# Patient Record
Sex: Female | Born: 1985 | Race: Black or African American | Hispanic: No | Marital: Single | Smoking: Never smoker
Health system: Southern US, Community
[De-identification: ages and names within clinical notes are randomized; demographics above are authoritative.]

## PROBLEM LIST (undated history)

## (undated) DIAGNOSIS — J45909 Unspecified asthma, uncomplicated: Secondary | ICD-10-CM

## (undated) DIAGNOSIS — F419 Anxiety disorder, unspecified: Secondary | ICD-10-CM

## (undated) DIAGNOSIS — F32A Depression, unspecified: Secondary | ICD-10-CM

## (undated) DIAGNOSIS — F329 Major depressive disorder, single episode, unspecified: Secondary | ICD-10-CM

## (undated) DIAGNOSIS — T7840XA Allergy, unspecified, initial encounter: Secondary | ICD-10-CM

## (undated) HISTORY — PX: WISDOM TOOTH EXTRACTION: SHX21

## (undated) HISTORY — DX: Allergy, unspecified, initial encounter: T78.40XA

## (undated) HISTORY — DX: Unspecified asthma, uncomplicated: J45.909

## (undated) HISTORY — PX: SKIN LESION EXCISION: SHX2412

## (undated) HISTORY — PX: TONSILLECTOMY: SUR1361

---
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
## 2004-03-03 ENCOUNTER — Emergency Department (HOSPITAL_COMMUNITY): Admission: EM | Admit: 2004-03-03 | Discharge: 2004-03-03 | Payer: Self-pay | Admitting: Family Medicine

## 2004-06-14 ENCOUNTER — Emergency Department (HOSPITAL_COMMUNITY): Admission: EM | Admit: 2004-06-14 | Discharge: 2004-06-14 | Payer: Self-pay | Admitting: *Deleted

## 2005-03-29 ENCOUNTER — Emergency Department (HOSPITAL_COMMUNITY): Admission: EM | Admit: 2005-03-29 | Discharge: 2005-03-29 | Payer: Self-pay | Admitting: Family Medicine

## 2005-08-05 ENCOUNTER — Other Ambulatory Visit: Admission: RE | Admit: 2005-08-05 | Discharge: 2005-08-05 | Payer: Self-pay | Admitting: Gynecology

## 2010-05-26 ENCOUNTER — Ambulatory Visit: Payer: Self-pay | Admitting: Women's Health

## 2010-05-26 ENCOUNTER — Other Ambulatory Visit: Admission: RE | Admit: 2010-05-26 | Discharge: 2010-05-26 | Payer: Self-pay | Admitting: Gynecology

## 2010-08-13 ENCOUNTER — Ambulatory Visit: Payer: Self-pay | Admitting: Women's Health

## 2010-08-24 ENCOUNTER — Ambulatory Visit: Payer: Self-pay | Admitting: Women's Health

## 2010-10-22 ENCOUNTER — Ambulatory Visit: Payer: Self-pay | Admitting: Women's Health

## 2010-11-22 HISTORY — PX: COSMETIC SURGERY: SHX468

## 2012-05-03 ENCOUNTER — Ambulatory Visit (INDEPENDENT_AMBULATORY_CARE_PROVIDER_SITE_OTHER): Payer: BC Managed Care – PPO | Admitting: Women's Health

## 2012-05-03 ENCOUNTER — Encounter: Payer: Self-pay | Admitting: Women's Health

## 2012-05-03 ENCOUNTER — Other Ambulatory Visit (HOSPITAL_COMMUNITY)
Admission: RE | Admit: 2012-05-03 | Discharge: 2012-05-03 | Disposition: A | Discharge status: Home / Self Care | Payer: BC Managed Care – PPO | Source: Ambulatory Visit | Attending: Obstetrics and Gynecology | Admitting: Obstetrics and Gynecology

## 2012-05-03 VITALS — BP 118/66 | Ht 65.0 in | Wt 204.0 lb

## 2012-05-03 DIAGNOSIS — Z113 Encounter for screening for infections with a predominantly sexual mode of transmission: Secondary | ICD-10-CM

## 2012-05-03 DIAGNOSIS — Z01419 Encounter for gynecological examination (general) (routine) without abnormal findings: Secondary | ICD-10-CM | POA: Insufficient documentation

## 2012-05-03 DIAGNOSIS — IMO0001 Reserved for inherently not codable concepts without codable children: Secondary | ICD-10-CM

## 2012-05-03 DIAGNOSIS — N898 Other specified noninflammatory disorders of vagina: Secondary | ICD-10-CM

## 2012-05-03 DIAGNOSIS — F419 Anxiety disorder, unspecified: Secondary | ICD-10-CM

## 2012-05-03 DIAGNOSIS — Z833 Family history of diabetes mellitus: Secondary | ICD-10-CM

## 2012-05-03 DIAGNOSIS — F411 Generalized anxiety disorder: Secondary | ICD-10-CM

## 2012-05-03 DIAGNOSIS — Z309 Encounter for contraceptive management, unspecified: Secondary | ICD-10-CM

## 2012-05-03 LAB — CBC WITH DIFFERENTIAL/PLATELET
Basophils Absolute: 0 10*3/uL (ref 0.0–0.1)
Eosinophils Relative: 6 % — ABNORMAL HIGH (ref 0–5)
HCT: 42 % (ref 36.0–46.0)
Lymphocytes Relative: 47 % — ABNORMAL HIGH (ref 12–46)
Lymphs Abs: 2.5 10*3/uL (ref 0.7–4.0)
MCV: 77.1 fL — ABNORMAL LOW (ref 78.0–100.0)
Neutro Abs: 2 10*3/uL (ref 1.7–7.7)
Platelets: 288 10*3/uL (ref 150–400)
RBC: 5.45 MIL/uL — ABNORMAL HIGH (ref 3.87–5.11)
WBC: 5.3 10*3/uL (ref 4.0–10.5)

## 2012-05-03 LAB — WET PREP FOR TRICH, YEAST, CLUE: Trich, Wet Prep: NONE SEEN

## 2012-05-03 MED ORDER — FLUCONAZOLE 150 MG PO TABS: 150.0000 mg | ORAL_TABLET | Freq: Once | ORAL | Status: AC

## 2012-05-03 MED ORDER — ETONOGESTREL-ETHINYL ESTRADIOL 0.12-0.015 MG/24HR VA RING: VAGINAL_RING | VAGINAL | Status: DC

## 2012-05-03 NOTE — Progress Notes (Signed)
Melissa Greene 1986-09-17 [id number]    History:    The patient presents for annual exam.  Monthly 5-6 day cycle/condoms/new partner. Gardasil series completed in 07. 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   Past medical history, past surgical history, family history and social history were all reviewed and documented in the EPIC chart. Math teacher in middle school. Had plastic surgery on arms due to excess skin/fat from weight loss in 2012.   ROS:  A  ROS was performed and pertinent positives and negatives are included in the history.  Exam:  Filed Vitals:   05/03/12 1036  BP: 118/66    General appearance:  Normal Head/Neck:  Normal, without cervical or supraclavicular adenopathy. Thyroid:  Symmetrical, normal in size, without palpable masses or nodularity. Respiratory  Effort:  Normal  Auscultation:  Clear without wheezing or rhonchi Cardiovascular  Auscultation:  Regular rate, without rubs, murmurs or gallops  Edema/varicosities:  Not grossly evident Abdominal  Soft,nontender, without masses, guarding or rebound.  Liver/spleen:  No organomegaly noted  Hernia:  None appreciated  Skin  Inspection:  Grossly normal  Palpation:  Grossly normal Neurologic/psychiatric  Orientation:  Normal with appropriate conversation.  Mood/affect:  Normal  Genitourinary    Breasts: Examined lying and sitting.     Right: Without masses, retractions, discharge or axillary adenopathy.     Left: Without masses, retractions, discharge or axillary adenopathy.   Inguinal/mons:  Normal without inguinal adenopathy  External genitalia:  Normal  BUS/Urethra/Skene's glands:  Normal  Bladder:  Normal  Vagina:  Normal-wet prep negative  Cervix:  Normal  Uterus:   normal in size, shape and contour.  Midline and mobile  Adnexa/parametria:     Rt: Without masses or tenderness.   Lt: Without masses or tenderness.  Anus and perineum: Normal  Digital rectal  exam: Normal sphincter tone without palpated masses or tenderness  Assessment/Plan:  26 y.o. SBF G0 for annual exam with complaint of vaginal irritation occasionally.  Normal GYN exam STD screen  Plan: Contraception options reviewed, requested NuvaRing, prescription, proper use, start up instructions, slight risk for blood clots and strokes reviewed, encouraged condoms until permanent partner. SBE's, exercise, calcium rich diet, MVI daily encouraged. CBC, glucose, UA, Pap, GC/Chlamydia, declines HIV, hepatitis or RPR.   Farhana Fellows J WHNP, 1:20 PM 05/03/2012

## 2012-05-03 NOTE — Patient Instructions (Signed)

## 2012-05-04 LAB — URINALYSIS W MICROSCOPIC + REFLEX CULTURE
Bilirubin Urine: NEGATIVE
Crystals: NONE SEEN
Leukocytes, UA: NEGATIVE
Nitrite: NEGATIVE
Protein, ur: NEGATIVE mg/dL
Specific Gravity, Urine: 1.02 (ref 1.005–1.030)
Squamous Epithelial / LPF: NONE SEEN
Urobilinogen, UA: 0.2 mg/dL (ref 0.0–1.0)

## 2012-05-04 LAB — GC/CHLAMYDIA PROBE AMP, GENITAL: GC Probe Amp, Genital: NEGATIVE

## 2012-11-07 ENCOUNTER — Encounter: Payer: Self-pay | Admitting: Gynecology

## 2012-11-07 ENCOUNTER — Ambulatory Visit (INDEPENDENT_AMBULATORY_CARE_PROVIDER_SITE_OTHER): Payer: BC Managed Care – PPO | Admitting: Gynecology

## 2012-11-07 DIAGNOSIS — N898 Other specified noninflammatory disorders of vagina: Secondary | ICD-10-CM

## 2012-11-07 NOTE — Patient Instructions (Signed)
Take Flagyl twice daily for 7 days, avoid alcohol while taking. Start NuvaRing after next menses. Continues condoms as backup contraception as well as to decrease STDs. Follow up if any issues.

## 2012-11-07 NOTE — Progress Notes (Signed)
Patient presents complaining of a brownish discharge. Had history of intercourse without a condom then used a condom and subsequently started the NuvaRing with her next following menses. Had a discharge following this and took the NuvaRing out. She now has a persistent brownish discharge. No pain odor itching.  Exam with [employer] assistant External BUS vagina with white discharge. Cervix normal. Uterus normal size midline mobile nontender. Adnexa without masses or tenderness.  Assessment and plan: Wet prep shows bacterial vaginosis. GC Chlamydia screen done. Treat Flagyl 500 twice a day x7 days, alcohol avoidance. She will restart NuvaRing following the onset of her next menses, Sunday start. Backup contraception with condoms regardless as well as to help decrease STDs.

## 2012-11-10 ENCOUNTER — Telehealth: Payer: Self-pay | Admitting: *Deleted

## 2012-11-10 MED ORDER — CLINDAMYCIN PHOSPHATE 2 % VA CREA: 1.0000 | TOPICAL_CREAM | Freq: Every day | VAGINAL | Status: DC

## 2012-11-10 MED ORDER — METRONIDAZOLE 500 MG PO TABS: 500.0000 mg | ORAL_TABLET | Freq: Two times a day (BID) | ORAL | Status: DC

## 2012-11-10 NOTE — Telephone Encounter (Signed)
Cleocin vaginal cream nightly x7 days 

## 2012-11-10 NOTE — Telephone Encounter (Signed)
Pt was giving rx for Flagyl 500 twice a day x7 days on 11/07/12, pt said she would prefer to take a gel rather than pill if possible. Please advise

## 2012-11-10 NOTE — Telephone Encounter (Signed)
Pt called because pharmacy never received rx for Flagyl 500 twice a day x7 days. rx sent, pt informed.

## 2012-11-13 NOTE — Telephone Encounter (Signed)
Left on voicemail rx sent. 

## 2013-02-19 ENCOUNTER — Encounter: Payer: Self-pay | Admitting: *Deleted

## 2013-02-19 ENCOUNTER — Encounter: Payer: Self-pay | Admitting: Women's Health

## 2013-02-19 ENCOUNTER — Ambulatory Visit (INDEPENDENT_AMBULATORY_CARE_PROVIDER_SITE_OTHER): Payer: BC Managed Care – PPO | Admitting: Women's Health

## 2013-02-19 DIAGNOSIS — B373 Candidiasis of vulva and vagina: Secondary | ICD-10-CM

## 2013-02-19 DIAGNOSIS — Z309 Encounter for contraceptive management, unspecified: Secondary | ICD-10-CM

## 2013-02-19 DIAGNOSIS — Z113 Encounter for screening for infections with a predominantly sexual mode of transmission: Secondary | ICD-10-CM

## 2013-02-19 DIAGNOSIS — L293 Anogenital pruritus, unspecified: Secondary | ICD-10-CM

## 2013-02-19 DIAGNOSIS — N898 Other specified noninflammatory disorders of vagina: Secondary | ICD-10-CM

## 2013-02-19 DIAGNOSIS — IMO0001 Reserved for inherently not codable concepts without codable children: Secondary | ICD-10-CM

## 2013-02-19 LAB — WET PREP FOR TRICH, YEAST, CLUE
Clue Cells Wet Prep HPF POC: NONE SEEN
Trich, Wet Prep: NONE SEEN
Yeast Wet Prep HPF POC: NONE SEEN

## 2013-02-19 MED ORDER — FLUCONAZOLE 100 MG PO TABS: 100.0000 mg | ORAL_TABLET | Freq: Every day | ORAL | Status: DC

## 2013-02-19 MED ORDER — NORGESTIMATE-ETH ESTRADIOL 0.25-35 MG-MCG PO TABS: 1.0000 | ORAL_TABLET | Freq: Every day | ORAL | Status: DC

## 2013-02-19 NOTE — Progress Notes (Signed)
Patient ID: Melissa Greene, female   DOB: 09/19/1986, 27 y.o.   MRN: [id number] Presents with complaint of vaginal itching and would like to change to oral contraceptive from nuva ring. Placed nuva ring in on Sunday, removed due to itching, currently on cycle.Questions if partner unfaithful. Denies urinary symptoms, abdominal pain, or fever. Took diflucan several days ago but has not had complete relief.  Exam: Appears well, external genitalia slightly erythematous at introitus, speculum exam scant menses noted, wet prep negative, GC/Chlamydia culture taken, bimanual no CMT or adnexal fullness or tenderness.  Vaginal pruritus Contraception management STD screen  Plan: Diflucan 100 times one dose, prescription, proper use given and reviewed states uses Diflucan 100 after most cycles to prevent recurrent yeast with good relief. GC/Chlamydia culture pending, declines HIV today will have labs drawn at annual exam in June. Condoms encouraged if sexually active. Contraception options reviewed will try Ortho-Cyclen, prescription, proper use given and reviewed slight risk for blood clots and strokes. Instructed to call if continued problems.Aware of yeast prevention, reviewed wet prep negative.

## 2013-02-19 NOTE — Progress Notes (Signed)
Patient ID: Melissa Greene, female   DOB: 28-Sep-1986, 28 y.o.   MRN: [id number] Pt left message c/o extreme vaginal itching after cycle, I left message on her voicemail that OV best.

## 2013-02-19 NOTE — Patient Instructions (Addendum)
Monilial Vaginitis Vaginitis in a soreness, swelling and redness (inflammation) of the vagina and vulva. Monilial vaginitis is not a sexually transmitted infection. CAUSES  Yeast vaginitis is caused by yeast (candida) that is normally found in your vagina. With a yeast infection, the candida has overgrown in number to a point that upsets the chemical balance. SYMPTOMS   White, thick vaginal discharge.  Swelling, itching, redness and irritation of the vagina and possibly the lips of the vagina (vulva).  Burning or painful urination.  Painful intercourse. DIAGNOSIS  Things that may contribute to monilial vaginitis are:  Postmenopausal and virginal states.  Pregnancy.  Infections.  Being tired, sick or stressed, especially if you had monilial vaginitis in the past.  Diabetes. Good control will help lower the chance.  Birth control pills.  Tight fitting garments.  Using bubble bath, feminine sprays, douches or deodorant tampons.  Taking certain medications that kill germs (antibiotics).  Sporadic recurrence can occur if you become ill. TREATMENT  Your caregiver will give you medication.  There are several kinds of anti monilial vaginal creams and suppositories specific for monilial vaginitis. For recurrent yeast infections, use a suppository or cream in the vagina 2 times a week, or as directed.  Anti-monilial or steroid cream for the itching or irritation of the vulva may also be used. Get your caregiver's permission.  Painting the vagina with methylene blue solution may help if the monilial cream does not work.  Eating yogurt may help prevent monilial vaginitis. HOME CARE INSTRUCTIONS   Finish all medication as prescribed.  Do not have sex until treatment is completed or after your caregiver tells you it is okay.  Take warm sitz baths.  Do not douche.  Do not use tampons, especially scented ones.  Wear cotton underwear.  Avoid tight pants and panty  hose.  Tell your sexual partner that you have a yeast infection. They should go to their caregiver if they have symptoms such as mild rash or itching.  Your sexual partner should be treated as well if your infection is difficult to eliminate.  Practice safer sex. Use condoms.  Some vaginal medications cause latex condoms to fail. Vaginal medications that harm condoms are:  Cleocin cream.  Butoconazole (Femstat).  Terconazole (Terazol) vaginal suppository.  Miconazole (Monistat) (may be purchased over the counter). SEEK MEDICAL CARE IF:   You have a temperature by mouth above 102 F (38.9 C).  The infection is getting worse after 2 days of treatment.  The infection is not getting better after 3 days of treatment.  You develop blisters in or around your vagina.  You develop vaginal bleeding, and it is not your menstrual period.  You have pain when you urinate.  You develop intestinal problems.  You have pain with sexual intercourse. Document Released: 08/18/2005 Document Revised: 01/31/2012 Document Reviewed: 05/02/2009 ExitCare Patient Information 2013 [employer].  

## 2013-02-20 LAB — URINALYSIS W MICROSCOPIC + REFLEX CULTURE
Ketones, ur: NEGATIVE mg/dL
Nitrite: NEGATIVE
Protein, ur: NEGATIVE mg/dL

## 2013-02-20 LAB — GC/CHLAMYDIA PROBE AMP
CT Probe RNA: NEGATIVE
GC Probe RNA: NEGATIVE

## 2013-02-21 LAB — URINE CULTURE: Colony Count: 80000

## 2013-02-23 ENCOUNTER — Telehealth: Payer: Self-pay | Admitting: *Deleted

## 2013-02-23 NOTE — Telephone Encounter (Signed)
Pt informed with Negative GC/Chalmydia results. Pt said she tried to have sex for 2 night and had pain during sex. Pt was given #15 tablet of diflucan, I told pt pain during sex maybe related to irritation for infection, wait until finish with Rx.

## 2013-03-12 ENCOUNTER — Telehealth: Payer: Self-pay | Admitting: *Deleted

## 2013-03-12 ENCOUNTER — Encounter: Payer: Self-pay | Admitting: *Deleted

## 2013-03-12 MED ORDER — NYSTATIN-TRIAMCINOLONE 100000-0.1 UNIT/GM-% EX OINT: TOPICAL_OINTMENT | Freq: Two times a day (BID) | CUTANEOUS | Status: DC

## 2013-03-12 MED ORDER — TERCONAZOLE 0.8 % VA CREA: 1.0000 | TOPICAL_CREAM | Freq: Every day | VAGINAL | Status: DC

## 2013-03-12 NOTE — Telephone Encounter (Signed)
I would recommend Mytrex cream externally now. Terazol 3 day cream after her menses. Office visit if symptoms persist

## 2013-03-12 NOTE — Telephone Encounter (Signed)
Left the below note on pt voicemail, rx sent.

## 2013-03-12 NOTE — Telephone Encounter (Signed)
(  You are back up MD) pt seen nancy for vaginal itching given rx for # 15 diflucan on 02/19/13, she took medication as directed and still having extreme vaginal itching. Pt said she has had pain with intercourse and had some bleeding after intercourse as well, no vaginal discharge, no odor. Her cycle is on now and can't make OV due to this, she asked for your recommendations? She states the itching feels like it internal. Please advise

## 2013-03-13 ENCOUNTER — Encounter: Payer: Self-pay | Admitting: Gynecology

## 2013-03-13 ENCOUNTER — Ambulatory Visit (INDEPENDENT_AMBULATORY_CARE_PROVIDER_SITE_OTHER): Payer: BC Managed Care – PPO | Admitting: Gynecology

## 2013-03-13 DIAGNOSIS — N898 Other specified noninflammatory disorders of vagina: Secondary | ICD-10-CM

## 2013-03-13 DIAGNOSIS — L293 Anogenital pruritus, unspecified: Secondary | ICD-10-CM

## 2013-03-13 MED ORDER — FLUCONAZOLE 200 MG PO TABS: 200.0000 mg | ORAL_TABLET | Freq: Every day | ORAL | Status: DC

## 2013-03-13 NOTE — Progress Notes (Signed)
Patient presents with a history of being treated for bacterial vaginosis in December. Her symptoms cleared and was subsequently seen by Harriett Sine 02/19/2013 with intense vaginal itching.  Was treated with Diflucan 100 mg. Her itching has continued and she noted some perivaginal swelling. She was worried that she had herpes.  Exam with Selena Batten assistant External with small pigmented area on the clitoral hood benign in appearance. No ulcerative or inflammatory changes. Currently on menses. Vagina with blood. Cervix grossly normal. Uterus grossly normal in size shape contour midline mobile nontender. Adnexa without masses or tenderness.  Assessment and plan: Persistent vaginal itching. I think probably inadequately treated yeast. Unable to evaluate down deep to the menses flow. We'll treat aggressively with Diflucan 200 mg daily x5 days. She does have a prescription for Mytrex that was called in several days ago and she has not used it yet and will apply this 2-3 times daily. Followup if symptoms persist or recur. I discussed the small pigmented area which I think is a nevus. She was worried that it was herpes. I did ask her to keep track of this and his lungs remains unchanged we'll follow. Would certainly change or enlarge we may have to consider biopsy.

## 2013-03-13 NOTE — Patient Instructions (Signed)
Take Diflucan pill daily for 5 days. Apply the Mytrex cream 2-3 times daily to the outside. Followup if her symptoms persist or recur. Follow the pigmented area over the clitoral region. If it changes at all we may have to consider biopsy.

## 2013-07-26 ENCOUNTER — Ambulatory Visit (INDEPENDENT_AMBULATORY_CARE_PROVIDER_SITE_OTHER): Payer: BC Managed Care – PPO | Admitting: Women's Health

## 2013-07-26 ENCOUNTER — Encounter: Payer: Self-pay | Admitting: Women's Health

## 2013-07-26 VITALS — BP 126/78 | Ht 66.0 in | Wt 229.6 lb

## 2013-07-26 DIAGNOSIS — N760 Acute vaginitis: Secondary | ICD-10-CM

## 2013-07-26 DIAGNOSIS — N898 Other specified noninflammatory disorders of vagina: Secondary | ICD-10-CM

## 2013-07-26 DIAGNOSIS — B9689 Other specified bacterial agents as the cause of diseases classified elsewhere: Secondary | ICD-10-CM

## 2013-07-26 DIAGNOSIS — A499 Bacterial infection, unspecified: Secondary | ICD-10-CM

## 2013-07-26 LAB — WET PREP FOR TRICH, YEAST, CLUE: WBC, Wet Prep HPF POC: NONE SEEN

## 2013-07-26 MED ORDER — METRONIDAZOLE 0.75 % VA GEL: VAGINAL | Status: DC

## 2013-07-26 MED ORDER — METRONIDAZOLE 500 MG PO TABS: 500.0000 mg | ORAL_TABLET | Freq: Two times a day (BID) | ORAL | Status: DC

## 2013-07-26 NOTE — Patient Instructions (Addendum)
Bacterial Vaginosis Bacterial vaginosis (BV) is a vaginal infection where the normal balance of bacteria in the vagina is disrupted. The normal balance is then replaced by an overgrowth of certain bacteria. There are several different kinds of bacteria that can cause BV. BV is the most common vaginal infection in women of childbearing age. CAUSES   The cause of BV is not fully understood. BV develops when there is an increase or imbalance of harmful bacteria.  Some activities or behaviors can upset the normal balance of bacteria in the vagina and put women at increased risk including:  Having a new sex partner or multiple sex partners.  Douching.  Using an intrauterine device (IUD) for contraception.  It is not clear what role sexual activity plays in the development of BV. However, women that have never had sexual intercourse are rarely infected with BV. Women do not get BV from toilet seats, bedding, swimming pools or from touching objects around them.  SYMPTOMS   Grey vaginal discharge.  A fish-like odor with discharge, especially after sexual intercourse.  Itching or burning of the vagina and vulva.  Burning or pain with urination.  Some women have no signs or symptoms at all. DIAGNOSIS  Your caregiver must examine the vagina for signs of BV. Your caregiver will perform lab tests and look at the sample of vaginal fluid through a microscope. They will look for bacteria and abnormal cells (clue cells), a pH test higher than 4.5, and a positive amine test all associated with BV.  RISKS AND COMPLICATIONS   Pelvic inflammatory disease (PID).  Infections following gynecology surgery.  Developing HIV.  Developing herpes virus. TREATMENT  Sometimes BV will clear up without treatment. However, all women with symptoms of BV should be treated to avoid complications, especially if gynecology surgery is planned. Female partners generally do not need to be treated. However, BV may spread  between female sex partners so treatment is helpful in preventing a recurrence of BV.   BV may be treated with antibiotics. The antibiotics come in either pill or vaginal cream forms. Either can be used with nonpregnant or pregnant women, but the recommended dosages differ. These antibiotics are not harmful to the baby.  BV can recur after treatment. If this happens, a second round of antibiotics will often be prescribed.  Treatment is important for pregnant women. If not treated, BV can cause a premature delivery, especially for a pregnant woman who had a premature birth in the past. All pregnant women who have symptoms of BV should be checked and treated.  For chronic reoccurrence of BV, treatment with a type of prescribed gel vaginally twice a week is helpful. HOME CARE INSTRUCTIONS   Finish all medication as directed by your caregiver.  Do not have sex until treatment is completed.  Tell your sexual partner that you have a vaginal infection. They should see their caregiver and be treated if they have problems, such as a mild rash or itching.  Practice safe sex. Use condoms. Only have 1 sex partner. PREVENTION  Basic prevention steps can help reduce the risk of upsetting the natural balance of bacteria in the vagina and developing BV:  Do not have sexual intercourse (be abstinent).  Do not douche.  Use all of the medicine prescribed for treatment of BV, even if the signs and symptoms go away.  Tell your sex partner if you have BV. That way, they can be treated, if needed, to prevent reoccurrence. SEEK MEDICAL CARE IF:     Your symptoms are not improving after 3 days of treatment.  You have increased discharge, pain, or fever. MAKE SURE YOU:   Understand these instructions.  Will watch your condition.  Will get help right away if you are not doing well or get worse. FOR MORE INFORMATION  Division of STD Prevention (DSTDP), Centers for Disease Control and Prevention:  www.cdc.gov/std American Social Health Association (ASHA): www.ashastd.org  Document Released: 11/08/2005 Document Revised: 01/31/2012 Document Reviewed: 05/01/2009 ExitCare Patient Information 2014 [employer].  

## 2013-07-26 NOTE — Progress Notes (Signed)
Patient ID: Melissa Greene, female   DOB: 09-Mar-1986, 27 y.o.   MRN: [id number] Presents with complaint of vaginal discharge with odor. Reports has had problems with recurrent BV. Has used over-the-counter Monistat without relief. Monthly cycle, condoms usually, requests  STD screen. Denies urinary symptoms, abdominal pain, fever.  Exam: Appears well, external genitalia within normal limits, speculum exam moderate amount of menses with odor noted, wet prep positive for amines, clues, TNTC bacteria. GC/Chlamydia culture taken. Bimanual no CMT or adnexal fullness or tenderness. Exam somewhat limited due to abdominal girth.  Bacteria vaginosis  Plan: Flagyl 500 twice daily for 7 days, alcohol precautions reviewed, call or return if no relief of symptoms, condoms encouraged until permanent partner. Declines contraception. GC/Chlamydia culture pending. Will check HIV, hepatitis and RPR at annual exam.

## 2013-07-27 LAB — GC/CHLAMYDIA PROBE AMP: CT Probe RNA: NEGATIVE

## 2013-09-27 ENCOUNTER — Other Ambulatory Visit: Payer: Self-pay

## 2013-10-26 ENCOUNTER — Ambulatory Visit (INDEPENDENT_AMBULATORY_CARE_PROVIDER_SITE_OTHER): Payer: BC Managed Care – PPO

## 2013-10-26 VITALS — BP 117/49 | HR 95 | Resp 18

## 2013-10-26 DIAGNOSIS — B070 Plantar wart: Secondary | ICD-10-CM

## 2013-10-26 DIAGNOSIS — M79609 Pain in unspecified limb: Secondary | ICD-10-CM

## 2013-10-26 DIAGNOSIS — Q828 Other specified congenital malformations of skin: Secondary | ICD-10-CM

## 2013-10-26 NOTE — Patient Instructions (Signed)
Pre-Operative Instructions  Congratulations, you have decided to take an important step to improving your quality of life.  You can be assured that the doctors of [facility] will be with you every step of the way.  1. Plan to be at the surgery center/hospital at least 1 (one) hour prior to your scheduled time unless otherwise directed by the surgical center/hospital staff.  You must have a responsible adult accompany you, remain during the surgery and drive you home.  Make sure you have directions to the surgical center/hospital and know how to get there on time. 2. For hospital based surgery you will need to obtain a history and physical form from your family physician within 1 month prior to the date of surgery- we will give you a form for you primary physician.  3. We make every effort to accommodate the date you request for surgery.  There are however, times where surgery dates or times have to be moved.  We will contact you as soon as possible if a change in schedule is required.   4. No Aspirin/Ibuprofen for one week before surgery.  If you are on aspirin, any non-steroidal anti-inflammatory medications (Mobic, Aleve, Ibuprofen) you should stop taking it 7 days prior to your surgery.  You make take Tylenol  For pain prior to surgery.  5. Medications- If you are taking daily heart and blood pressure medications, seizure, reflux, allergy, asthma, anxiety, pain or diabetes medications, make sure the surgery center/hospital is aware before the day of surgery so they may notify you which medications to take or avoid the day of surgery. 6. No food or drink after midnight the night before surgery unless directed otherwise by surgical center/hospital staff. 7. No alcoholic beverages 24 hours prior to surgery.  No smoking 24 hours prior to or 24 hours after surgery. 8. Wear loose pants or shorts- loose enough to fit over bandages, boots, and casts. 9. No slip on shoes, sneakers are best. 10. Bring  your boot with you to the surgery center/hospital.  Also bring crutches or a walker if your physician has prescribed it for you.  If you do not have this equipment, it will be provided for you after surgery. 11. If you have not been contracted by the surgery center/hospital by the day before your surgery, call to confirm the date and time of your surgery. 12. Leave-time from work may vary depending on the type of surgery you have.  Appropriate arrangements should be made prior to surgery with your employer. 13. Prescriptions will be provided immediately following surgery by your doctor.  Have these filled as soon as possible after surgery and take the medication as directed. 14. Remove nail polish on the operative foot. 15. Wash the night before surgery.  The night before surgery wash the foot and leg well with the antibacterial soap provided and water paying special attention to beneath the toenails and in between the toes.  Rinse thoroughly with water and dry well with a towel.  Perform this wash unless told not to do so by your physician.  Enclosed: 1 Ice pack (please put in freezer the night before surgery)   1 Hibiclens skin cleaner   Pre-op Instructions  If you have any questions regarding the instructions, do not hesitate to call our office.  [place]: [address] [place], [place] [address] [phone]  [place]: [address] [place], [place] [address] [phone]  [place]: [address]  [place], [place] [address] [phone]  Dr. Allison Silva   Tuchman DPM, Dr. Norman Regal DPM Dr. Analyse Angst DPM, Dr. M. Todd Hyatt DPM, Dr. Kathryn Egerton DPM 

## 2013-10-26 NOTE — Progress Notes (Signed)
   Subjective:    Patient ID: Melissa Greene, female    DOB: [birth year], 27 y.o.   MRN: [id number]  HPI I saw Dr. Raechel Chute last year for my right foot and hurts on the ball and the calluses hurt so bad and no swelling and sore and tender and since 2010 this is when it started    Review of Systems  Constitutional: Negative.   HENT: Negative.   Eyes: Negative.   Respiratory: Negative.   Cardiovascular: Negative.   Gastrointestinal: Negative.   Endocrine: Negative.   Genitourinary: Negative.   Musculoskeletal: Positive for back pain.  Skin: Negative.   Allergic/Immunologic: Negative.   Neurological: Negative.   Hematological: Bruises/bleeds easily.  Psychiatric/Behavioral: Negative.        Objective:   Physical Exam Neurovascular status is intact with pedal pulses palpable DP and PT +2/4 bilateral Refill timed 3-4 seconds all digits. Epicritic and Percocet to sensations intact and symmetric bilateral. Patient does have keratotic lesion sub-third metatarsal area right foot well nucleated circumscribed and painful. Patient relates this to posse stepping on some last 4 years ago getting out of the shower. Covered any bleeding is been treated by 4 doctors including Dr. Jarold Motto Dr. Raechel Chute. Patient has had an injection and some debridements all provided lower covered relief in the past. X-rays taken today reveal that the lesions proximal to the metatarsal head not in the third metatarsal head. Painful on direct lateral compression and debridement well nucleated core is identified this is consistent with porokeratosis or verruca plantaris. I did not feel that the lesion is secondary to plantar flexed metatarsal x-rays otherwise unremarkable. Hallux is rectus mild flexible digital contractures noted as well. No secondary infection is noted no ascending cellulitis or lymphangitis noted.     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 Plan at this time discussed options and having failed previous treatments my recommendations times for excision and biopsy do a full excision with suture closure this was explained the patient at this time consent form was reviewed and signed all questions asked by the patient are answered and surgery scheduled for this coming Monday in the [facility] office under local anesthetic block. Patient had an opportunity ask questions all questions asked medication are answered there no contraindications to surgery at this time. Patient is advised of his or verruca of his always transferred to come back at that is caused by a virus. Surgery scheduled at this time  Alvan Dame DPM

## 2013-10-29 ENCOUNTER — Ambulatory Visit (INDEPENDENT_AMBULATORY_CARE_PROVIDER_SITE_OTHER): Payer: BC Managed Care – PPO

## 2013-10-29 ENCOUNTER — Other Ambulatory Visit: Payer: Self-pay

## 2013-10-29 ENCOUNTER — Telehealth: Payer: Self-pay | Admitting: *Deleted

## 2013-10-29 DIAGNOSIS — M79609 Pain in unspecified limb: Secondary | ICD-10-CM

## 2013-10-29 DIAGNOSIS — Q828 Other specified congenital malformations of skin: Secondary | ICD-10-CM

## 2013-10-29 DIAGNOSIS — B070 Plantar wart: Secondary | ICD-10-CM

## 2013-10-29 MED ORDER — CLINDAMYCIN HCL 150 MG PO CAPS: 150.0000 mg | ORAL_CAPSULE | Freq: Four times a day (QID) | ORAL | Status: DC

## 2013-10-29 MED ORDER — HYDROCODONE-ACETAMINOPHEN 5-325 MG PO TABS: 1.0000 | ORAL_TABLET | ORAL | Status: DC | PRN

## 2013-10-29 NOTE — Patient Instructions (Signed)

## 2013-10-29 NOTE — Progress Notes (Signed)
Subjective:    Patient ID: Melissa Greene, female    DOB: February 27, 1986, 27 y.o.   MRN: [id number]  HPI patient presents for surgical excision of painful skin lesion plantar right foot, suspect verruca versus porokeratosis. Likely excision benign neoplasm skin for biopsy to    Review of Systems deferred at this time     Objective:   Physical Exam Neurovascular status is intact pedal pulses palpable bilateral epicritic and proprioceptive sensations intact and symmetric bilateral there is nucleated keratotic lesion sub-third MTP area proximal to that. Plantar right foot well nucleated circumscribed lesion is identified painful direct lateral compression. No new findings or changes noted orthopedic exam unremarkable noncontributory.       Assessment & Plan:  Operative note for right foot procedure.  Preoperative diagnosis: Suspect verruca plantaris versus porokeratosis. Benign neoplasm skin Postoperative diagnosis: The same Procedure:   Excision skin lesion plantar right foot 2 cm, and submitted for pathology. Anesthesia   local anesthetic block total of 6 cc 50-50 mixture 2% Xylocaine with epi 1 100,000 and 0.5% Marcaine plain. A regional     block Hemostasis   right ankle tourniquet 250 mm Hg x15 minutes, and the use of epinephrine with local block. EBL    minimal less than 1 cc Meds injectables  1 cc dexamethasone phosphate 4 mg  Indications   patient has had a several year history of painful nucleated keratotic lesion previous treatments and debridement provided     temporary relief based on symptomology and past history and patient request surgery proceeded scheduled. Findings and procedures   Patient was placed in the preoperative area where local anesthetic block was administered total of 6 cc 50-50 mixture of 2% Xylocaine with epinephrine 11 or thousand inserter 0.5% Marcaine plain in a regional sub-lesional block fashion. Patient was then transferred to the OR room. Patient  placed on table in the supine position. Local anesthetic administered blistered the foot was now prepped and draped in the usual aseptic manner utilizing Betadine was prepped. The right foot was then exsanguinated the Ace wrap and ankle tourniquet inflated to 250 mm Hg. The following procedure was then carried out  Excision skin lesion 2 cm plantar right foot. Attention was directed to the plantar aspect of the right foot sub-third MTP area. The nucleated lesion is identified and utilizing sharp dissection 2 semi-elliptical converging incisions were made in a longitudinal direction encompassing the skin lesion. The incisions were deepened to the level of subcutaneous tissue and the entire lesion was excised from the site in toto. The edges were noted to be smooth and intact. No foreign bodies were identified some of the underlying soft tissue and fat tissue was also excised with the lesion. At this time the edges were undermined utilizing hemostat. The site was lavaged with copious amounts of sterile antibiotic  solution and cleared of all soft tissue debris. At this time the skin edges were reapproximated and closed utilizing 3-0 nylon in a simple interrupted fashion the site was then infiltrated with 1 cc of dexamethasone phosphate 4 mg per cc. Betadine Xeroform and dry sterile dressing applied to the right foot, ankle tourniquet was then deflated with immediate return of perfusion to all digits being noted. Patient was returned to the arch recovery room in satisfactory condition placed in a Darco shoe for moderate ambulation. Oral and written postop instructions were given prescriptions for pain antibiotic medications were dispensed. Patient be recheck in one week for postop followup and dressing change. Should  note the lesion was submitted in formalin for pathology analysis.  Patient [name] procedure well was discharged from the office with or instructions and prescriptions being given will followup in one  week as instructed. 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  Alvan Dame DPM

## 2013-10-29 NOTE — Telephone Encounter (Signed)
Biopsy of neuclated painful skin lesion plantar Right foot sent by [employer] on 10/31/2103.

## 2013-11-02 ENCOUNTER — Ambulatory Visit: Payer: BC Managed Care – PPO

## 2013-11-06 ENCOUNTER — Telehealth: Payer: Self-pay | Admitting: *Deleted

## 2013-11-06 ENCOUNTER — Ambulatory Visit (INDEPENDENT_AMBULATORY_CARE_PROVIDER_SITE_OTHER): Payer: BC Managed Care – PPO

## 2013-11-06 VITALS — BP 131/59 | HR 75 | Temp 97.8°F | Resp 20 | Ht 64.0 in | Wt 230.0 lb

## 2013-11-06 DIAGNOSIS — B070 Plantar wart: Secondary | ICD-10-CM

## 2013-11-06 DIAGNOSIS — Z09 Encounter for follow-up examination after completed treatment for conditions other than malignant neoplasm: Secondary | ICD-10-CM

## 2013-11-06 MED ORDER — MELOXICAM 15 MG PO TABS: 15.0000 mg | ORAL_TABLET | Freq: Every day | ORAL | Status: DC

## 2013-11-06 NOTE — Telephone Encounter (Signed)
Dr Ralene Cork states biopsy result is verruca, and since some warts can become cancerous needs to be watched 6 moth to a 1 year.  Dr Dionne Bucy recommendations and result called to pt, she states understanding.

## 2013-11-06 NOTE — Patient Instructions (Signed)

## 2013-11-06 NOTE — Progress Notes (Addendum)
   Subjective:    Patient ID: Melissa Greene, female    DOB: 08/19/1986, 27 y.o.   MRN: [id number]   "I'm still in pain, It hurts when I walk."  HPI    Review of Systems no findings or changes     Objective:   Physical Exam Neurovascular status is intact patient is a day status post excision of porokeratosis or verrucoid lesion sub-third fourth metatarsal area of right foot. Patient is having some pain dressings intact and dry has been her feet a lot having some aching and throbbing as a result. Suggest elevating ice and prescription for Mobic is dispensed at this time. Otherwise neurovascular status is intact pedal pulses palpable incision clean dry and well coapted. Dry sterile compressive dressing reapplied at this time.       Assessment & Plan:  Good postop progress following excision of benign neoplasm skin suspect verruca plantar versus porokeratosis. Presto dressing reapplied at this time still waiting on pathology report patient be followed in one week for suture removal. Maintain dressing which is reapplied at this time allow for ice to help with edema and NSAID therapy. Reappointed one week Alvan Dame DPM  Addendum to note following patient's visit pathologic report was forwarded patient report was positive for atypical verrucous epithelial poor proliferation there was still some low-grade suspicion for squamous neoplasm. This worn as possible long-term monitoring. Patient will be contacted with her results the findings are achieved pretty consistent with a traumatized wart or verruca. However there are some atypical and enlarged nuclei. Nursing we'll contact patient with her results.  Alvan Dame DPM

## 2013-11-13 ENCOUNTER — Ambulatory Visit (INDEPENDENT_AMBULATORY_CARE_PROVIDER_SITE_OTHER): Payer: BC Managed Care – PPO

## 2013-11-13 VITALS — BP 121/61 | HR 67 | Resp 20 | Ht 64.0 in | Wt 230.0 lb

## 2013-11-13 DIAGNOSIS — Z09 Encounter for follow-up examination after completed treatment for conditions other than malignant neoplasm: Secondary | ICD-10-CM

## 2013-11-13 DIAGNOSIS — B070 Plantar wart: Secondary | ICD-10-CM

## 2013-11-13 NOTE — Progress Notes (Signed)
   Subjective:    Patient ID: Melissa Greene, female    DOB: [birth year], 27 y.o.   MRN: [id number]  "I think it's doing okay, I was having some pain last night.  I'm excited to jump in the shower today with both feet.  That Meloxicam made me feel loopy, I didn't like that."  HPI    Review of Systems deferred at this visit    Objective:   Physical Exam Neurovascular status is intact incision clean dry well coapted plantar right foot. At this time sutures are removed Neosporin and a gauze wrap is applied. Patient may resume normal bathing and hygiene starting later today or early tomorrow maintain Neosporin and Band-Aid or gauze wrap as needed for the first week. Maintain a thick soled padded a cushioned shoe at all times no barefoot or flimsy shoes monitor for any dehiscence or discharge patient is advised there is some scarring and some tenderness may be present for a week or more or up to a month or 2 after surgery monitor for any exacerbations.       Assessment & Plan:  Assessment good postop progress following excision of porokeratosis first verrucoid lesion plantar right foot sub-third fourth metatarsal area patient is doing well sutures removed may resume normal normal bathing and hygiene maintain cushioned shoes reappointed one month for long-term followup  Alvan Dame DPM

## 2013-11-13 NOTE — Patient Instructions (Signed)

## 2013-12-11 ENCOUNTER — Ambulatory Visit: Payer: BC Managed Care – PPO

## 2013-12-18 ENCOUNTER — Ambulatory Visit (INDEPENDENT_AMBULATORY_CARE_PROVIDER_SITE_OTHER): Payer: BC Managed Care – PPO

## 2013-12-18 VITALS — BP 141/78 | HR 80 | Resp 16

## 2013-12-18 DIAGNOSIS — Z09 Encounter for follow-up examination after completed treatment for conditions other than malignant neoplasm: Secondary | ICD-10-CM

## 2013-12-18 DIAGNOSIS — Q828 Other specified congenital malformations of skin: Secondary | ICD-10-CM

## 2013-12-18 DIAGNOSIS — B070 Plantar wart: Secondary | ICD-10-CM

## 2013-12-18 NOTE — Patient Instructions (Signed)
Recommend to apply lotion such as cocoa butter or any kind of cream or lotion to the bottom of the foot daily also use a pumice stone or callus file on an as-needed basis for the residual scar of the right foot.  Followup in 3 months for long-term postop check

## 2013-12-18 NOTE — Progress Notes (Signed)
   Subjective:    Patient ID: Edd ArbourMelanie M Leyh, female    DOB: 07/17/1986, 28 y.o.   MRN: [id number]  HPI Comments: "I am 150% better"     Review of Systems no new changes or findings    Objective:   Physical Exam Neurovascular status is intact pedal pulses palpable there is some thickening or scar tissue keratoses sub-fourth third fourth metatarsal area right foot which is debrided back at this time. Some tenderness and debridement otherwise doing much better than prior to surgery.       Assessment & Plan:  Assessment good postop progress status post excision of poor keratosis or verruca lesion plantar right foot sub-third fourth metatarsal area no discharge or drainage keratoses debrided recommended cocoa butter or any lotions the foot daily suggest a 3 month long-term postop followup and likely discharge thereafter next  Alvan Dameichard Eron Staat DPM

## 2014-01-25 ENCOUNTER — Encounter: Payer: Self-pay | Admitting: Women's Health

## 2014-01-25 ENCOUNTER — Ambulatory Visit (INDEPENDENT_AMBULATORY_CARE_PROVIDER_SITE_OTHER): Payer: BC Managed Care – PPO | Admitting: Women's Health

## 2014-01-25 DIAGNOSIS — A499 Bacterial infection, unspecified: Secondary | ICD-10-CM

## 2014-01-25 DIAGNOSIS — N760 Acute vaginitis: Secondary | ICD-10-CM

## 2014-01-25 DIAGNOSIS — Z113 Encounter for screening for infections with a predominantly sexual mode of transmission: Secondary | ICD-10-CM

## 2014-01-25 DIAGNOSIS — B9689 Other specified bacterial agents as the cause of diseases classified elsewhere: Secondary | ICD-10-CM

## 2014-01-25 LAB — WET PREP FOR TRICH, YEAST, CLUE
Trich, Wet Prep: NONE SEEN
Yeast Wet Prep HPF POC: NONE SEEN

## 2014-01-25 MED ORDER — METRONIDAZOLE 0.75 % VA GEL: VAGINAL | Status: DC

## 2014-01-25 MED ORDER — METRONIDAZOLE 500 MG PO TABS: 500.0000 mg | ORAL_TABLET | Freq: Two times a day (BID) | ORAL | Status: DC

## 2014-01-25 NOTE — Progress Notes (Signed)
Patient ID: Melissa Greene, female   DOB: 09-18-1986, 28 y.o.   MRN: [id number] Presents for GC/chlamydia screening, had unprotected intercourse. Has had problems with recurrent BV in the past, also states has had increased discharge. Denies urinary symptoms, abdominal pain or fever. Monthly cycles/condoms. Has rare intercourse.   Exam: Appears well. External genitalia within normal limits, speculum exam moderate milky discharge noted wet prep positive for amines, clues, TNTC bacteria. GC/Chlamydia culture pending. Bimanual no CMT or fullness noted.  Bacteria vaginosis STD screen  Plan: GC/Chlamydia culture pending. Declined HIV, hepatitis or RPR. Contraception options reviewed and declined. Will continue condoms. Flagyl 500 twice daily for 7 days, alcohol precautions reviewed. Uses one applicator of MetroGel after menses for prevention of BV.

## 2014-01-26 LAB — GC/CHLAMYDIA PROBE AMP
CT PROBE, AMP APTIMA: NEGATIVE
GC PROBE AMP APTIMA: NEGATIVE

## 2014-02-27 ENCOUNTER — Telehealth: Payer: Self-pay

## 2014-02-27 MED ORDER — LEVONORGESTREL 0.75 MG PO TABS: 0.7500 mg | ORAL_TABLET | Freq: Two times a day (BID) | ORAL | Status: DC

## 2014-02-27 NOTE — Telephone Encounter (Signed)
Rx sent. Patient informed. 

## 2014-02-27 NOTE — Telephone Encounter (Addendum)
Patient had unprotected sex earlier. Wants to take the morning after pill but with "financial difficulties".  It is expensive OTC but if we call it in for her she can use her BeneCard that has $ on it to pay for it. She just doesn't have the cash right now for OTC.  Ok to try to call it in for her ?

## 2014-02-27 NOTE — Telephone Encounter (Signed)
Yes, call in plan B emergency contraception   thanks

## 2014-05-02 ENCOUNTER — Telehealth: Payer: Self-pay

## 2014-05-02 ENCOUNTER — Other Ambulatory Visit: Payer: Self-pay | Admitting: Women's Health

## 2014-05-02 MED ORDER — LEVONORGESTREL 0.75 MG PO TABS: 0.7500 mg | ORAL_TABLET | Freq: Two times a day (BID) | ORAL | Status: DC

## 2014-05-02 NOTE — Telephone Encounter (Signed)
Ok please call in plan B emergency contraception

## 2014-05-02 NOTE — Telephone Encounter (Signed)
Patient wants you to prescribe Plan B.  If it is called in to pharmacy she said she can use her Flex Dollars and right now she doesn't have money to buy over the counter med.  OK?

## 2014-05-02 NOTE — Telephone Encounter (Signed)
Rx sent. Patient informed. 

## 2014-05-02 NOTE — Telephone Encounter (Signed)
Error encounter already opened

## 2014-06-06 ENCOUNTER — Telehealth: Payer: Self-pay | Admitting: *Deleted

## 2014-06-06 NOTE — Telephone Encounter (Signed)
Pt called with questions about pregnancy UPT. All question answered.

## 2014-10-22 ENCOUNTER — Ambulatory Visit (INDEPENDENT_AMBULATORY_CARE_PROVIDER_SITE_OTHER): Payer: BC Managed Care – PPO | Admitting: Women's Health

## 2014-10-22 ENCOUNTER — Other Ambulatory Visit (HOSPITAL_COMMUNITY)
Admission: RE | Admit: 2014-10-22 | Discharge: 2014-10-22 | Disposition: A | Discharge status: Home / Self Care | Payer: BC Managed Care – PPO | Source: Ambulatory Visit | Attending: Gynecology | Admitting: Gynecology

## 2014-10-22 ENCOUNTER — Encounter: Payer: Self-pay | Admitting: Women's Health

## 2014-10-22 VITALS — BP 126/80 | Ht 64.0 in | Wt 238.0 lb

## 2014-10-22 DIAGNOSIS — Z833 Family history of diabetes mellitus: Secondary | ICD-10-CM

## 2014-10-22 DIAGNOSIS — Z30019 Encounter for initial prescription of contraceptives, unspecified: Secondary | ICD-10-CM

## 2014-10-22 DIAGNOSIS — N898 Other specified noninflammatory disorders of vagina: Secondary | ICD-10-CM

## 2014-10-22 DIAGNOSIS — B9689 Other specified bacterial agents as the cause of diseases classified elsewhere: Secondary | ICD-10-CM

## 2014-10-22 DIAGNOSIS — Z01419 Encounter for gynecological examination (general) (routine) without abnormal findings: Secondary | ICD-10-CM

## 2014-10-22 DIAGNOSIS — N760 Acute vaginitis: Secondary | ICD-10-CM

## 2014-10-22 DIAGNOSIS — A499 Bacterial infection, unspecified: Secondary | ICD-10-CM

## 2014-10-22 LAB — WET PREP FOR TRICH, YEAST, CLUE: Trich, Wet Prep: NONE SEEN

## 2014-10-22 MED ORDER — METRONIDAZOLE 0.75 % VA GEL: VAGINAL | Status: DC

## 2014-10-22 MED ORDER — FLUCONAZOLE 150 MG PO TABS: 150.0000 mg | ORAL_TABLET | Freq: Once | ORAL | Status: DC

## 2014-10-22 MED ORDER — NORETHIN ACE-ETH ESTRAD-FE 1-20 MG-MCG PO TABS: 1.0000 | ORAL_TABLET | Freq: Every day | ORAL | Status: DC

## 2014-10-22 NOTE — Progress Notes (Signed)
Edd ArbourMelanie M Cunliffe 1986/01/27 [id number]    History:    Presents for annual exam.  Regular monthly cycle/ condoms/same partner. Gardasil series completed. 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 Has gained about 30 pounds in the past year relates it to stress and less exercise.  Past medical history, past surgical history, family history and social history were all reviewed and documented in the EPIC chart. 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 Mother healthy, father deceased -heart disease and hypertension.  ROS:  A  12 point ROS was performed and pertinent positives and negatives are included.  Exam:  Filed Vitals:   10/22/14 1537  BP: 126/80    General appearance:  Normal Thyroid:  Symmetrical, normal in size, without palpable masses or nodularity. Respiratory  Auscultation:  Clear without wheezing or rhonchi Cardiovascular  Auscultation:  Regular rate, without rubs, murmurs or gallops  Edema/varicosities:  Not grossly evident Abdominal  Soft,nontender, without masses, guarding or rebound.  Liver/spleen:  No organomegaly noted  Hernia:  None appreciated  Skin  Inspection:  Grossly normal   Breasts: Examined lying and sitting.     Right: Without masses, retractions, discharge or axillary adenopathy.     Left: Without masses, retractions, discharge or axillary adenopathy. Gentitourinary   Inguinal/mons:  Normal without inguinal adenopathy  External genitalia:  Normal  BUS/Urethra/Skene's glands:  Normal  Vagina:  White discharge, wet prep positive for yeast, amines, clues, TNTC bacteria  Cervix:  Normal  Uterus:   normal in size, shape and contour.  Midline and mobile  Adnexa/parametria:     Rt: Without masses or tenderness.   Lt: Without masses or tenderness.  Anus and perineum: Normal  Digital rectal exam: Normal sphincter tone without palpated masses or tenderness  Assessment/Plan:  28 y.o. SBF G0 for annual exam.     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 SBE's, increase regular exercise, decrease calories for weight loss, MVI daily encouraged. Contraception options reviewed will try Loestrin 1/20 prescription, proper use, slight risk for blood clots and strokes reviewed, start with next cycle condoms until and especially first month and for infection control. CBC, glucose, UA, Pap. Stress reduction reviewed, reviewed importance of limiting alcohol, increasing regular exercise and leisure activities.   Harrington ChallengerYOUNG,Nandi Tonnesen J K[facility] Regional Medical CenterWHNP, 4:50 PM 10/22/2014

## 2014-10-22 NOTE — Patient Instructions (Addendum)
Health Maintenance Adopting a healthy lifestyle and getting preventive care can go a long way to promote health and wellness. Talk with your health care provider about what schedule of regular examinations is right for you. This is a good chance for you to check in with your provider about disease prevention and staying healthy. In between checkups, there are plenty of things you can do on your own. Experts have done a lot of research about which lifestyle changes and preventive measures are most likely to keep you healthy. Ask your health care provider for more information. WEIGHT AND DIET  Eat a healthy diet  Be sure to include plenty of vegetables, fruits, low-fat dairy products, and lean protein.  Do not eat a lot of foods high in solid fats, added sugars, or salt.  Get regular exercise. This is one of the most important things you can do for your health.  Most adults should exercise for at least 150 minutes each week. The exercise should increase your heart rate and make you sweat (moderate-intensity exercise).  Most adults should also do strengthening exercises at least twice a week. This is in addition to the moderate-intensity exercise.  Maintain a healthy weight  Body mass index (BMI) is a measurement that can be used to identify possible weight problems. It estimates body fat based on height and weight. Your health care provider can help determine your BMI and help you achieve or maintain a healthy weight.  For females 25 years of age and older:   A BMI below 18.5 is considered underweight.  A BMI of 18.5 to 24.9 is normal.  A BMI of 25 to 29.9 is considered overweight.  A BMI of 30 and above is considered obese.  Watch levels of cholesterol and blood lipids  You should start having your blood tested for lipids and cholesterol at 29 years of age, then have this test every 5 years.  You may need to have your cholesterol levels checked more often if:  Your lipid or  cholesterol levels are high.  You are older than 29 years of age.  You are at high risk for heart disease.  CANCER SCREENING   Lung Cancer  Lung cancer screening is recommended for adults 97-92 years old who are at high risk for lung cancer because of a history of smoking.  A yearly low-dose CT scan of the lungs is recommended for people who:  Currently smoke.  Have quit within the past 15 years.  Have at least a 30-pack-year history of smoking. A pack year is smoking an average of one pack of cigarettes a day for 1 year.  Yearly screening should continue until it has been 15 years since you quit.  Yearly screening should stop if you develop a health problem that would prevent you from having lung cancer treatment.  Breast Cancer  Practice breast self-awareness. This means understanding how your breasts normally appear and feel.  It also means doing regular breast self-exams. Let your health care provider know about any changes, no matter how small.  If you are in your 20s or 30s, you should have a clinical breast exam (CBE) by a health care provider every 1-3 years as part of a regular health exam.  If you are 76 or older, have a CBE every year. Also consider having a breast X-ray (mammogram) every year.  If you have a family history of breast cancer, talk to your health care provider about genetic screening.  If you are  at high risk for breast cancer, talk to your health care provider about having an MRI and a mammogram every year.  Breast cancer gene (BRCA) assessment is recommended for women who have family members with BRCA-related cancers. BRCA-related cancers include:  Breast.  Ovarian.  Tubal.  Peritoneal cancers.  Results of the assessment will determine the need for genetic counseling and BRCA1 and BRCA2 testing. Cervical Cancer Routine pelvic examinations to screen for cervical cancer are no longer recommended for nonpregnant women who are considered low  risk for cancer of the pelvic organs (ovaries, uterus, and vagina) and who do not have symptoms. A pelvic examination may be necessary if you have symptoms including those associated with pelvic infections. Ask your health care provider if a screening pelvic exam is right for you.   The Pap test is the screening test for cervical cancer for women who are considered at risk.  If you had a hysterectomy for a problem that was not cancer or a condition that could lead to cancer, then you no longer need Pap tests.  If you are older than 65 years, and you have had normal Pap tests for the past 10 years, you no longer need to have Pap tests.  If you have had past treatment for cervical cancer or a condition that could lead to cancer, you need Pap tests and screening for cancer for at least 20 years after your treatment.  If you no longer get a Pap test, assess your risk factors if they change (such as having a new sexual partner). This can affect whether you should start being screened again.  Some women have medical problems that increase their chance of getting cervical cancer. If this is the case for you, your health care provider may recommend more frequent screening and Pap tests.  The human papillomavirus (HPV) test is another test that may be used for cervical cancer screening. The HPV test looks for the virus that can cause cell changes in the cervix. The cells collected during the Pap test can be tested for HPV.  The HPV test can be used to screen women 30 years of age and older. Getting tested for HPV can extend the interval between normal Pap tests from three to five years.  An HPV test also should be used to screen women of any age who have unclear Pap test results.  After 30 years of age, women should have HPV testing as often as Pap tests.  Colorectal Cancer  This type of cancer can be detected and often prevented.  Routine colorectal cancer screening usually begins at 29 years of  age and continues through 29 years of age.  Your health care provider may recommend screening at an earlier age if you have risk factors for colon cancer.  Your health care provider may also recommend using home test kits to check for hidden blood in the stool.  A small camera at the end of a tube can be used to examine your colon directly (sigmoidoscopy or colonoscopy). This is done to check for the earliest forms of colorectal cancer.  Routine screening usually begins at age 50.  Direct examination of the colon should be repeated every 5-10 years through 29 years of age. However, you may need to be screened more often if early forms of precancerous polyps or small growths are found. Skin Cancer  Check your skin from head to toe regularly.  Tell your health care provider about any new moles or changes in   moles, especially if there is a change in a mole's shape or color.  Also tell your health care provider if you have a mole that is larger than the size of a pencil eraser.  Always use sunscreen. Apply sunscreen liberally and repeatedly throughout the day.  Protect yourself by wearing long sleeves, pants, a wide-brimmed hat, and sunglasses whenever you are outside. HEART DISEASE, DIABETES, AND HIGH BLOOD PRESSURE   Have your blood pressure checked at least every 1-2 years. High blood pressure causes heart disease and increases the risk of stroke.  If you are between 75 years and 42 years old, ask your health care provider if you should take aspirin to prevent strokes.  Have regular diabetes screenings. This involves taking a blood sample to check your fasting blood sugar level.  If you are at a normal weight and have a low risk for diabetes, have this test once every three years after 29 years of age.  If you are overweight and have a high risk for diabetes, consider being tested at a younger age or more often. PREVENTING INFECTION  Hepatitis B  If you have a higher risk for  hepatitis B, you should be screened for this virus. You are considered at high risk for hepatitis B if:  You were born in a country where hepatitis B is common. Ask your health care provider which countries are considered high risk.  Your parents were born in a high-risk country, and you have not been immunized against hepatitis B (hepatitis B vaccine).  You have HIV or AIDS.  You use needles to inject street drugs.  You live with someone who has hepatitis B.  You have had sex with someone who has hepatitis B.  You get hemodialysis treatment.  You take certain medicines for conditions, including cancer, organ transplantation, and autoimmune conditions. Hepatitis C  Blood testing is recommended for:  Everyone born from [birth year] through 1966.  Anyone with known risk factors for hepatitis C. Sexually transmitted infections (STIs)  You should be screened for sexually transmitted infections (STIs) including gonorrhea and chlamydia if:  You are sexually active and are younger than 29 years of age.  You are older than 29 years of age and your health care provider tells you that you are at risk for this type of infection.  Your sexual activity has changed since you were last screened and you are at an increased risk for chlamydia or gonorrhea. Ask your health care provider if you are at risk.  If you do not have HIV, but are at risk, it may be recommended that you take a prescription medicine daily to prevent HIV infection. This is called pre-exposure prophylaxis (PrEP). You are considered at risk if:  You are sexually active and do not regularly use condoms or know the HIV status of your partner(s).  You take drugs by injection.  You are sexually active with a partner who has HIV. Talk with your health care provider about whether you are at high risk of being infected with HIV. If you choose to begin PrEP, you should first be tested for HIV. You should then be tested every 3 months for  as long as you are taking PrEP.  PREGNANCY   If you are premenopausal and you may become pregnant, ask your health care provider about preconception counseling.  If you may become pregnant, take 400 to 800 micrograms (mcg) of folic acid every day.  If you want to prevent pregnancy, talk to your  health care provider about birth control (contraception). OSTEOPOROSIS AND MENOPAUSE   Osteoporosis is a disease in which the bones lose minerals and strength with aging. This can result in serious bone fractures. Your risk for osteoporosis can be identified using a bone density scan.  If you are 65 years of age or older, or if you are at risk for osteoporosis and fractures, ask your health care provider if you should be screened.  Ask your health care provider whether you should take a calcium or vitamin D supplement to lower your risk for osteoporosis.  Menopause may have certain physical symptoms and risks.  Hormone replacement therapy may reduce some of these symptoms and risks. Talk to your health care provider about whether hormone replacement therapy is right for you.  HOME CARE INSTRUCTIONS   Schedule regular health, dental, and eye exams.  Stay current with your immunizations.   Do not use any tobacco products including cigarettes, chewing tobacco, or electronic cigarettes.  If you are pregnant, do not drink alcohol.  If you are breastfeeding, limit how much and how often you drink alcohol.  Limit alcohol intake to no more than 1 drink per day for nonpregnant women. One drink equals 12 ounces of beer, 5 ounces of wine, or 1 ounces of hard liquor.  Do not use street drugs.  Do not share needles.  Ask your health care provider for help if you need support or information about quitting drugs.  Tell your health care provider if you often feel depressed.  Tell your health care provider if you have ever been abused or do not feel safe at home. Document Released: 05/24/2011  Document Revised: 03/25/2014 Document Reviewed: 10/10/2013 Exit[employer] Patient Information 2015 [employer]. This information is not intended to replace advice given to you by your health care provider. Make sure you discuss any questions you have with your health care provider. Bacterial Vaginosis Bacterial vaginosis is an infection of the vagina. It happens when too many of certain germs (bacteria) grow in the vagina. HOME CARE  Take your medicine as told by your doctor.  Finish your medicine even if you start to feel better.  Do not have sex until you finish your medicine and are better.  Tell your sex partner that you have an infection. They should see their doctor for treatment.  Practice safe sex. Use condoms. Have only one sex partner. GET HELP IF:  You are not getting better after 3 days of treatment.  You have more grey fluid (discharge) coming from your vagina than before.  You have more pain than before.  You have a fever. MAKE SURE YOU:   Understand these instructions.  Will watch your condition.  Will get help right away if you are not doing well or get worse. Document Released: 08/17/2008 Document Revised: 08/29/2013 Document Reviewed: 06/20/2013 Exit[employer] Patient Information 2015 [employer]. This information is not intended to replace advice given to you by your health care provider. Make sure you discuss any questions you have with your health care provider.  

## 2014-10-23 LAB — URINALYSIS W MICROSCOPIC + REFLEX CULTURE
Bacteria, UA: NONE SEEN
Bilirubin Urine: NEGATIVE
CASTS: NONE SEEN
CRYSTALS: NONE SEEN
GLUCOSE, UA: NEGATIVE mg/dL
Ketones, ur: NEGATIVE mg/dL
LEUKOCYTES UA: NEGATIVE
Nitrite: NEGATIVE
PH: 6 (ref 5.0–8.0)
Protein, ur: NEGATIVE mg/dL
Specific Gravity, Urine: 1.028 (ref 1.005–1.030)
Squamous Epithelial / LPF: NONE SEEN
Urobilinogen, UA: 0.2 mg/dL (ref 0.0–1.0)

## 2014-10-24 LAB — CYTOLOGY - PAP

## 2014-12-26 ENCOUNTER — Ambulatory Visit (INDEPENDENT_AMBULATORY_CARE_PROVIDER_SITE_OTHER): Payer: BC Managed Care – PPO | Admitting: Family Medicine

## 2014-12-26 VITALS — BP 120/70 | HR 88 | Temp 98.5°F | Resp 16 | Ht 64.0 in | Wt 243.0 lb

## 2014-12-26 DIAGNOSIS — R0602 Shortness of breath: Secondary | ICD-10-CM

## 2014-12-26 DIAGNOSIS — J069 Acute upper respiratory infection, unspecified: Secondary | ICD-10-CM

## 2014-12-26 DIAGNOSIS — J45901 Unspecified asthma with (acute) exacerbation: Secondary | ICD-10-CM

## 2014-12-26 MED ORDER — ALBUTEROL SULFATE HFA 108 (90 BASE) MCG/ACT IN AERS: 2.0000 | INHALATION_SPRAY | RESPIRATORY_TRACT | Status: DC | PRN

## 2014-12-26 MED ORDER — IPRATROPIUM BROMIDE 0.03 % NA SOLN: 2.0000 | Freq: Two times a day (BID) | NASAL | Status: DC

## 2014-12-26 MED ORDER — ALBUTEROL SULFATE (2.5 MG/3ML) 0.083% IN NEBU
2.5000 mg | INHALATION_SOLUTION | Freq: Once | RESPIRATORY_TRACT | Status: AC
Start: 2014-12-26 — End: 2014-12-26
  Administered 2014-12-26: 2.5 mg via RESPIRATORY_TRACT

## 2014-12-26 MED ORDER — GUAIFENESIN ER 1200 MG PO TB12: 1.0000 | ORAL_TABLET | Freq: Two times a day (BID) | ORAL | Status: DC | PRN

## 2014-12-26 MED ORDER — MOMETASONE FURO-FORMOTEROL FUM 100-5 MCG/ACT IN AERO: 2.0000 | INHALATION_SPRAY | Freq: Two times a day (BID) | RESPIRATORY_TRACT | Status: DC

## 2014-12-26 NOTE — Progress Notes (Signed)
Subjective:    Patient ID: Melissa ArbourMelanie M Yim, female    DOB: 1986-03-13, 29 y.o.   MRN: [id number]  HPI Patient with PMH of asthma and allergies presents with difficulty breathing and chest pressure that have been present for past 2 days. Initially thought sx where attributed to allergies and weather change, however, did not improve with taking zyrtec. Sx accompanied with congestion, productive cough, rhinorrhea, and sinus pressure. Denies fever, wheezing, sore throat, ear pressure, and N/V. Also took ibuprofen and tylenol cold without relief. Not currently taking any medication for asthma. Allergic to PCN and ceclor.    Review of Systems  Constitutional: Negative for fever, chills and fatigue.  HENT: Positive for congestion, rhinorrhea and sinus pressure. Negative for ear pain, sneezing and sore throat.   Eyes: Negative.   Respiratory: Positive for cough, chest tightness and shortness of breath. Negative for wheezing.   Cardiovascular: Negative for chest pain.  Gastrointestinal: Negative for nausea, vomiting and abdominal pain.  Allergic/Immunologic: Positive for environmental allergies. Negative for food allergies.  Neurological: Negative for dizziness and headaches.       Objective:   Physical Exam  Constitutional: She is oriented to person, place, and time. She appears well-developed and well-nourished. No distress.  Blood pressure 120/70, pulse 88, temperature 98.5 F (36.9 C), temperature source Oral, resp. rate 16, height 5\' 4"  (1.626 m), weight 243 lb (110.224 kg), SpO2 97 %.  HENT:  Head: Normocephalic and atraumatic.  Right Ear: Tympanic membrane, external ear and ear canal normal.  Left Ear: Tympanic membrane, external ear and ear canal normal.  Nose: Rhinorrhea (with minimal erythema) present. No mucosal edema or sinus tenderness. Right sinus exhibits no maxillary sinus tenderness and no frontal sinus tenderness. Left sinus exhibits no maxillary sinus tenderness and no  frontal sinus tenderness.  Mouth/Throat: Oropharynx is clear and moist. No oropharyngeal exudate.  Eyes: Conjunctivae are normal. Pupils are equal, round, and reactive to light. Right eye exhibits no discharge. Left eye exhibits no discharge. No scleral icterus.  Neck: Normal range of motion. Neck supple. No thyromegaly present.  Cardiovascular: Normal rate, regular rhythm and normal heart sounds.  Exam reveals no gallop and no friction rub.   No murmur heard. Pulmonary/Chest: Effort normal. No accessory muscle usage. No respiratory distress. She has no decreased breath sounds. She has wheezes in the right upper field, the right middle field, the right lower field, the left upper field, the left middle field and the left lower field. She has no rhonchi. She has no rales. She exhibits no tenderness.  Musculoskeletal: Normal range of motion.  Lymphadenopathy:    She has no cervical adenopathy.  Neurological: She is alert and oriented to person, place, and time.  Skin: Skin is warm and dry. No rash noted. She is not diaphoretic. No erythema. No pallor.       Assessment & Plan:  1. Asthma with acute exacerbation, unspecified asthma severity Likely flare from URI as patient has not been affected with asthma sx in a few years. Will call back of follow up in 6-8 weeks. Sooner if sx persist or worsen. - albuterol (PROVENTIL HFA;VENTOLIN HFA) 108 (90 BASE) MCG/ACT inhaler; Inhale 2 puffs into the lungs every 4 (four) hours as needed for wheezing or shortness of breath (cough, shortness of breath or wheezing.).  Dispense: 1 Inhaler; Refill: 5 - mometasone-formoterol (DULERA) 100-5 MCG/ACT AERO; Inhale 2 puffs into the lungs 2 (two) times daily.  Dispense: 1 Inhaler; Refill: 5  2. Acute upper  respiratory infection Take Muncinex with plenty of water.  - ipratropium (ATROVENT) 0.03 % nasal spray; Place 2 sprays into both nostrils 2 (two) times daily.  Dispense: 30 mL; Refill: 0 - Guaifenesin (MUCINEX  MAXIMUM STRENGTH) 1200 MG TB12; Take 1 tablet (1,200 mg total) by mouth every 12 (twelve) hours as needed.  Dispense: 14 tablet; Refill: 1  3. Shortness of breath - albuterol (PROVENTIL) (2.5 MG/3ML) 0.083% nebulizer solution 2.5 mg; Take 3 mLs (2.5 mg total) by nebulization once.   Janan Ridge PA-C  [facility] 12/26/2014 9:48 AM

## 2014-12-26 NOTE — Patient Instructions (Signed)

## 2015-03-13 ENCOUNTER — Encounter: Payer: Self-pay | Admitting: Women's Health

## 2015-03-13 ENCOUNTER — Ambulatory Visit (INDEPENDENT_AMBULATORY_CARE_PROVIDER_SITE_OTHER): Payer: BC Managed Care – PPO | Admitting: Women's Health

## 2015-03-13 VITALS — BP 120/74 | Ht 64.0 in | Wt 239.0 lb

## 2015-03-13 DIAGNOSIS — A499 Bacterial infection, unspecified: Secondary | ICD-10-CM | POA: Diagnosis not present

## 2015-03-13 DIAGNOSIS — B9689 Other specified bacterial agents as the cause of diseases classified elsewhere: Secondary | ICD-10-CM

## 2015-03-13 DIAGNOSIS — Z113 Encounter for screening for infections with a predominantly sexual mode of transmission: Secondary | ICD-10-CM

## 2015-03-13 DIAGNOSIS — N760 Acute vaginitis: Secondary | ICD-10-CM | POA: Diagnosis not present

## 2015-03-13 DIAGNOSIS — B373 Candidiasis of vulva and vagina: Secondary | ICD-10-CM

## 2015-03-13 DIAGNOSIS — B3731 Acute candidiasis of vulva and vagina: Secondary | ICD-10-CM

## 2015-03-13 LAB — WET PREP FOR TRICH, YEAST, CLUE: Trich, Wet Prep: NONE SEEN

## 2015-03-13 MED ORDER — FLUCONAZOLE 150 MG PO TABS: 150.0000 mg | ORAL_TABLET | Freq: Once | ORAL | Status: DC

## 2015-03-13 MED ORDER — METRONIDAZOLE 500 MG PO TABS: 500.0000 mg | ORAL_TABLET | Freq: Two times a day (BID) | ORAL | Status: DC

## 2015-03-13 NOTE — Patient Instructions (Signed)
Bacterial Vaginosis Bacterial vaginosis is an infection of the vagina. It happens when too many of certain germs (bacteria) grow in the vagina. HOME CARE  Take your medicine as told by your doctor.  Finish your medicine even if you start to feel better.  Do not have sex until you finish your medicine and are better.  Tell your sex partner that you have an infection. They should see their doctor for treatment.  Practice safe sex. Use condoms. Have only one sex partner. GET HELP IF:  You are not getting better after 3 days of treatment.  You have more grey fluid (discharge) coming from your vagina than before.  You have more pain than before.  You have a fever. MAKE SURE YOU:   Understand these instructions.  Will watch your condition.  Will get help right away if you are not doing well or get worse. Document Released: 08/17/2008 Document Revised: 08/29/2013 Document Reviewed: 06/20/2013 [employer] Patient Information 2015 [employer]. This information is not intended to replace advice given to you by your health care provider. Make sure you discuss any questions you have with your health care provider.  

## 2015-03-13 NOTE — Progress Notes (Signed)
Patient ID: Edd ArbourMelanie M Nixon, female   DOB: September 06, 1986, 29 y.o.   MRN: [id number] Presents with request for STD screen. New partner, condoms usually. Contraceptives on Loestrin.  Having slight discharge with irritation. Denies urinary symptoms, abdominal pain or fever.  Exam: Appears well. External genitalia minimal erythema, speculum exam moderate amount of a white adherent discharge, wet prep positive for yeast, clues, amines, TNTC bacteria. GC/Chlamydia culture taken. Bimanual no CMT or fullness noted.  Yeast Bacterial vaginosis STD screen  Plan: Flagyl 500 twice daily for 7 days, alcohol precautions reviewed. Diflucan 150 by mouth 1 dose, call if no relief of discharge. GC/Chlamydia culture pending, HIV, hep B, C, RPR. Condoms encouraged until permanent partner.

## 2015-03-14 LAB — HIV ANTIBODY (ROUTINE TESTING W REFLEX): HIV 1&2 Ab, 4th Generation: NONREACTIVE

## 2015-03-14 LAB — GC/CHLAMYDIA PROBE AMP
CT Probe RNA: NEGATIVE
GC PROBE AMP APTIMA: NEGATIVE

## 2015-03-14 LAB — HEPATITIS B SURFACE ANTIGEN: HEP B S AG: NEGATIVE

## 2015-03-14 LAB — HEPATITIS C ANTIBODY: HCV Ab: NEGATIVE

## 2015-03-14 LAB — RPR

## 2015-03-16 ENCOUNTER — Encounter: Payer: Self-pay | Admitting: Women's Health

## 2015-05-27 ENCOUNTER — Encounter: Payer: Self-pay | Admitting: Women's Health

## 2015-05-27 ENCOUNTER — Ambulatory Visit (INDEPENDENT_AMBULATORY_CARE_PROVIDER_SITE_OTHER): Payer: BC Managed Care – PPO | Admitting: Women's Health

## 2015-05-27 DIAGNOSIS — N760 Acute vaginitis: Secondary | ICD-10-CM

## 2015-05-27 DIAGNOSIS — N9489 Other specified conditions associated with female genital organs and menstrual cycle: Secondary | ICD-10-CM

## 2015-05-27 DIAGNOSIS — B373 Candidiasis of vulva and vagina: Secondary | ICD-10-CM | POA: Diagnosis not present

## 2015-05-27 DIAGNOSIS — Z30019 Encounter for initial prescription of contraceptives, unspecified: Secondary | ICD-10-CM | POA: Diagnosis not present

## 2015-05-27 DIAGNOSIS — A499 Bacterial infection, unspecified: Secondary | ICD-10-CM

## 2015-05-27 DIAGNOSIS — B3731 Acute candidiasis of vulva and vagina: Secondary | ICD-10-CM

## 2015-05-27 DIAGNOSIS — B9689 Other specified bacterial agents as the cause of diseases classified elsewhere: Secondary | ICD-10-CM

## 2015-05-27 DIAGNOSIS — N898 Other specified noninflammatory disorders of vagina: Secondary | ICD-10-CM

## 2015-05-27 LAB — WET PREP FOR TRICH, YEAST, CLUE
Trich, Wet Prep: NONE SEEN
WBC WET PREP: NONE SEEN
Yeast Wet Prep HPF POC: NONE SEEN

## 2015-05-27 MED ORDER — NORGESTREL-ETHINYL ESTRADIOL 0.3-30 MG-MCG PO TABS: 1.0000 | ORAL_TABLET | Freq: Every day | ORAL | Status: DC

## 2015-05-27 MED ORDER — METRONIDAZOLE 500 MG PO TABS: 500.0000 mg | ORAL_TABLET | Freq: Two times a day (BID) | ORAL | Status: DC

## 2015-05-27 MED ORDER — FLUCONAZOLE 150 MG PO TABS: 150.0000 mg | ORAL_TABLET | Freq: Once | ORAL | Status: DC

## 2015-05-27 NOTE — Patient Instructions (Signed)
Bacterial Vaginosis Bacterial vaginosis is a vaginal infection that occurs when the normal balance of bacteria in the vagina is disrupted. It results from an overgrowth of certain bacteria. This is the most common vaginal infection in women of childbearing age. Treatment is important to prevent complications, especially in pregnant women, as it can cause a premature delivery. CAUSES  Bacterial vaginosis is caused by an increase in harmful bacteria that are normally present in smaller amounts in the vagina. Several different kinds of bacteria can cause bacterial vaginosis. However, the reason that the condition develops is not fully understood. RISK FACTORS Certain activities or behaviors can put you at an increased risk of developing bacterial vaginosis, including:  Having a new sex partner or multiple sex partners.  Douching.  Using an intrauterine device (IUD) for contraception. Women do not get bacterial vaginosis from toilet seats, bedding, swimming pools, or contact with objects around them. SIGNS AND SYMPTOMS  Some women with bacterial vaginosis have no signs or symptoms. Common symptoms include:  Grey vaginal discharge.  A fishlike odor with discharge, especially after sexual intercourse.  Itching or burning of the vagina and vulva.  Burning or pain with urination. DIAGNOSIS  Your health care provider will take a medical history and examine the vagina for signs of bacterial vaginosis. A sample of vaginal fluid may be taken. Your health care provider will look at this sample under a microscope to check for bacteria and abnormal cells. A vaginal pH test may also be done.  TREATMENT  Bacterial vaginosis may be treated with antibiotic medicines. These may be given in the form of a pill or a vaginal cream. A second round of antibiotics may be prescribed if the condition comes back after treatment.  HOME CARE INSTRUCTIONS   Only take over-the-counter or prescription medicines as  directed by your health care provider.  If antibiotic medicine was prescribed, take it as directed. Make sure you finish it even if you start to feel better.  Do not have sex until treatment is completed.  Tell all sexual partners that you have a vaginal infection. They should see their health care provider and be treated if they have problems, such as a mild rash or itching.  Practice safe sex by using condoms and only having one sex partner. SEEK MEDICAL CARE IF:   Your symptoms are not improving after 3 days of treatment.  You have increased discharge or pain.  You have a fever. MAKE SURE YOU:   Understand these instructions.  Will watch your condition.  Will get help right away if you are not doing well or get worse. FOR MORE INFORMATION  Centers for Disease Control and Prevention, Division of STD Prevention: [employer]s.co.zawww.cdc.gov/std American Sexual Health Association (ASHA): www.ashastd.org  Document Released: 11/08/2005 Document Revised: 08/29/2013 Document Reviewed: 06/20/2013 [facility] Patient Information 2015 [place], [place]. This information is not intended to replace advice given to you by your health care provider. Make sure you discuss any questions you have with your health care provider. Etonogestrel implant What is this medicine? ETONOGESTREL (et oh noe JES trel) is a contraceptive (birth control) device. It is used to prevent pregnancy. It can be used for up to 3 years. This medicine may be used for other purposes; ask your health care provider or pharmacist if you have questions. COMMON BRAND NAME(S): Implanon, Nexplanon What should I tell my health care provider before I take this medicine? They need to know if you have any of these conditions: -abnormal vaginal bleeding -blood  vessel disease or blood clots -cancer of the breast, cervix, or liver -depression -diabetes -gallbladder disease -headaches -heart disease or recent heart attack -high blood pressure -high  cholesterol -kidney disease -liver disease -renal disease -seizures -tobacco smoker -an unusual or allergic reaction to etonogestrel, other hormones, anesthetics or antiseptics, medicines, foods, dyes, or preservatives -pregnant or trying to get pregnant -breast-feeding How should I use this medicine? This device is inserted just under the skin on the inner side of your upper arm by a health care professional. Talk to your pediatrician regarding the use of this medicine in children. Special care may be needed. Overdosage: If you think you've taken too much of this medicine contact a poison control center or emergency room at once. Overdosage: If you think you have taken too much of this medicine contact a poison control center or emergency room at once. NOTE: This medicine is only for you. Do not share this medicine with others. What if I miss a dose? This does not apply. What may interact with this medicine? Do not take this medicine with any of the following medications: -amprenavir -bosentan -fosamprenavir This medicine may also interact with the following medications: -barbiturate medicines for inducing sleep or treating seizures -certain medicines for fungal infections like ketoconazole and itraconazole -griseofulvin -medicines to treat seizures like carbamazepine, felbamate, oxcarbazepine, phenytoin, topiramate -modafinil -phenylbutazone -rifampin -some medicines to treat HIV infection like atazanavir, indinavir, lopinavir, nelfinavir, tipranavir, ritonavir -St. John's wort This list may not describe all possible interactions. Give your health care provider a list of all the medicines, herbs, non-prescription drugs, or dietary supplements you use. Also tell them if you smoke, drink alcohol, or use illegal drugs. Some items may interact with your medicine. What should I watch for while using this medicine? This product does not protect you against HIV infection (AIDS) or other  sexually transmitted diseases. You should be able to feel the implant by pressing your fingertips over the skin where it was inserted. Tell your doctor if you cannot feel the implant. What side effects may I notice from receiving this medicine? Side effects that you should report to your doctor or health care professional as soon as possible: -allergic reactions like skin rash, itching or hives, swelling of the face, lips, or tongue -breast lumps -changes in vision -confusion, trouble speaking or understanding -dark urine -depressed mood -general ill feeling or flu-like symptoms -light-colored stools -loss of appetite, nausea -right upper belly pain -severe headaches -severe pain, swelling, or tenderness in the abdomen -shortness of breath, chest pain, swelling in a leg -signs of pregnancy -sudden numbness or weakness of the face, arm or leg -trouble walking, dizziness, loss of balance or coordination -unusual vaginal bleeding, discharge -unusually weak or tired -yellowing of the eyes or skin Side effects that usually do not require medical attention (Report these to your doctor or health care professional if they continue or are bothersome.): -acne -breast pain -changes in weight -cough -fever or chills -headache -irregular menstrual bleeding -itching, burning, and vaginal discharge -pain or difficulty passing urine -sore throat This list may not describe all possible side effects. Call your doctor for medical advice about side effects. You may report side effects to FDA at 1-800-FDA-1088. Where should I keep my medicine? This drug is given in a hospital or clinic and will not be stored at home. NOTE: This sheet is a summary. It may not cover all possible information. If you have questions about this medicine, talk to your doctor, pharmacist, or health  care provider.  2015, Elsevier/Gold Standard. (2012-05-15 15:37:45)

## 2015-05-27 NOTE — Progress Notes (Signed)
Patient ID: Melissa Greene, female   DOB: 10-28-1986, 29 y.o.   MRN: [id number]  HPI: Presents for vaginal odor and irregular periods. "Fishy odor" noted after sex. History of bacterial vaginitis. Denies discharge, burning, urinary symptoms, pelvic pain, fever/chills. Same partner/negative STD screen/condoms/microgestin. Also notes cycle starts either week 2 or 3 of pill pack,  Microgestin started in April. Often gets yeast symptoms after Flagyl  Exam: Well appearing External genitalia normal. Speculum exam moderate white discharge no erythema noted, - wet prep positive amine, few clue cells, many bacteria  Bacterial vaginitis Contraception management  Plan: Flagyl 500g BID, 7 days. Medication and alcohol avoidance reviewed. Boric Acid prescription twice weekly after symptoms resolved given . Diflucan 150 mg PRN. Yeast prevention reviewed. Finish out current pack of Microgestin, then start  Lo/Ovral 0.3-30. Slight stroke and blood clot risk reviewed. Return or call if symptoms do not improve/worsens

## 2015-09-12 ENCOUNTER — Ambulatory Visit (INDEPENDENT_AMBULATORY_CARE_PROVIDER_SITE_OTHER): Payer: BC Managed Care – PPO | Admitting: Women's Health

## 2015-09-12 ENCOUNTER — Encounter: Payer: Self-pay | Admitting: Women's Health

## 2015-09-12 VITALS — BP 118/72

## 2015-09-12 DIAGNOSIS — N898 Other specified noninflammatory disorders of vagina: Secondary | ICD-10-CM | POA: Diagnosis not present

## 2015-09-12 DIAGNOSIS — B3731 Acute candidiasis of vulva and vagina: Secondary | ICD-10-CM

## 2015-09-12 DIAGNOSIS — A499 Bacterial infection, unspecified: Secondary | ICD-10-CM

## 2015-09-12 DIAGNOSIS — B373 Candidiasis of vulva and vagina: Secondary | ICD-10-CM | POA: Diagnosis not present

## 2015-09-12 DIAGNOSIS — B9689 Other specified bacterial agents as the cause of diseases classified elsewhere: Secondary | ICD-10-CM | POA: Insufficient documentation

## 2015-09-12 DIAGNOSIS — N760 Acute vaginitis: Secondary | ICD-10-CM

## 2015-09-12 LAB — WET PREP FOR TRICH, YEAST, CLUE: TRICH WET PREP: NONE SEEN

## 2015-09-12 MED ORDER — FLUCONAZOLE 150 MG PO TABS: ORAL_TABLET | ORAL | Status: DC

## 2015-09-12 MED ORDER — METRONIDAZOLE 0.75 % VA GEL: VAGINAL | Status: DC

## 2015-09-12 NOTE — Progress Notes (Signed)
Patient ID: Melissa Greene, female   DOB: 03/26/1986, 29 y.o.   MRN: [id number] Presents with complaint of increased vaginal discharge with odor for several days. Same partner. Monthly cycle 2 weeks ago, did not pack up new Rx, will start back on OCs with cycle.  Exam: Appears well. External genitalia within normal limits, speculum exam moderate amount of a white adherent discharge with odor noted. Wet prep positive for yeast, amines, moderate clues and TNTC bacteria. Bimanual no CMT or adnexal tenderness.  Bacteria vaginosis Yeast vaginitis  Plan: Reviewed importance of condoms until next cycle, and condoms first month back on and for infection control. MetroGel vaginal cream 1 applicator at bedtime 5, alcohol precautions reviewed. Diflucan 150 by mouth 1 dose repeat in 3 days if needed. Instructed to call if no relief of discharge. Reviewed importance of condoms..Marland Kitchen

## 2015-09-12 NOTE — Patient Instructions (Signed)
Monilial Vaginitis Vaginitis in a soreness, swelling and redness (inflammation) of the vagina and vulva. Monilial vaginitis is not a sexually transmitted infection. CAUSES  Yeast vaginitis is caused by yeast (candida) that is normally found in your vagina. With a yeast infection, the candida has overgrown in number to a point that upsets the chemical balance. SYMPTOMS   White, thick vaginal discharge.  Swelling, itching, redness and irritation of the vagina and possibly the lips of the vagina (vulva).  Burning or painful urination.  Painful intercourse. DIAGNOSIS  Things that may contribute to monilial vaginitis are:  Postmenopausal and virginal states.  Pregnancy.  Infections.  Being tired, sick or stressed, especially if you had monilial vaginitis in the past.  Diabetes. Good control will help lower the chance.  Birth control pills.  Tight fitting garments.  Using bubble bath, feminine sprays, douches or deodorant tampons.  Taking certain medications that kill germs (antibiotics).  Sporadic recurrence can occur if you become ill. TREATMENT  Your caregiver will give you medication.  There are several kinds of anti monilial vaginal creams and suppositories specific for monilial vaginitis. For recurrent yeast infections, use a suppository or cream in the vagina 2 times a week, or as directed.  Anti-monilial or steroid cream for the itching or irritation of the vulva may also be used. Get your caregiver's permission.  Painting the vagina with methylene blue solution may help if the monilial cream does not work.  Eating yogurt may help prevent monilial vaginitis. HOME CARE INSTRUCTIONS   Finish all medication as prescribed.  Do not have sex until treatment is completed or after your caregiver tells you it is okay.  Take warm sitz baths.  Do not douche.  Do not use tampons, especially scented ones.  Wear cotton underwear.  Avoid tight pants and panty  hose.  Tell your sexual partner that you have a yeast infection. They should go to their caregiver if they have symptoms such as mild rash or itching.  Your sexual partner should be treated as well if your infection is difficult to eliminate.  Practice safer sex. Use condoms.  Some vaginal medications cause latex condoms to fail. Vaginal medications that harm condoms are:  Cleocin cream.  Butoconazole (Femstat).  Terconazole (Terazol) vaginal suppository.  Miconazole (Monistat) (may be purchased over the counter). SEEK MEDICAL CARE IF:   You have a temperature by mouth above 102 F (38.9 C).  The infection is getting worse after 2 days of treatment.  The infection is not getting better after 3 days of treatment.  You develop blisters in or around your vagina.  You develop vaginal bleeding, and it is not your menstrual period.  You have pain when you urinate.  You develop intestinal problems.  You have pain with sexual intercourse.   This information is not intended to replace advice given to you by your health care provider. Make sure you discuss any questions you have with your health care provider.   Document Released: 08/18/2005 Document Revised: 01/31/2012 Document Reviewed: 05/12/2015 Elsevier Interactive Patient Education 2016 Elsevier Inc. Bacterial Vaginosis Bacterial vaginosis is a vaginal infection that occurs when the normal balance of bacteria in the vagina is disrupted. It results from an overgrowth of certain bacteria. This is the most common vaginal infection in women of childbearing age. Treatment is important to prevent complications, especially in pregnant women, as it can cause a premature delivery. CAUSES  Bacterial vaginosis is caused by an increase in harmful bacteria that are normally present   in smaller amounts in the vagina. Several different kinds of bacteria can cause bacterial vaginosis. However, the reason that the condition develops is not  fully understood. RISK FACTORS Certain activities or behaviors can put you at an increased risk of developing bacterial vaginosis, including:  Having a new sex partner or multiple sex partners.  Douching.  Using an intrauterine device (IUD) for contraception. Women do not get bacterial vaginosis from toilet seats, bedding, swimming pools, or contact with objects around them. SIGNS AND SYMPTOMS  Some women with bacterial vaginosis have no signs or symptoms. Common symptoms include:  Grey vaginal discharge.  A fishlike odor with discharge, especially after sexual intercourse.  Itching or burning of the vagina and vulva.  Burning or pain with urination. DIAGNOSIS  Your health care provider will take a medical history and examine the vagina for signs of bacterial vaginosis. A sample of vaginal fluid may be taken. Your health care provider will look at this sample under a microscope to check for bacteria and abnormal cells. A vaginal pH test may also be done.  TREATMENT  Bacterial vaginosis may be treated with antibiotic medicines. These may be given in the form of a pill or a vaginal cream. A second round of antibiotics may be prescribed if the condition comes back after treatment. Because bacterial vaginosis increases your risk for sexually transmitted diseases, getting treated can help reduce your risk for chlamydia, gonorrhea, HIV, and herpes. HOME CARE INSTRUCTIONS   Only take over-the-counter or prescription medicines as directed by your health care provider.  If antibiotic medicine was prescribed, take it as directed. Make sure you finish it even if you start to feel better.  Tell all sexual partners that you have a vaginal infection. They should see their health care provider and be treated if they have problems, such as a mild rash or itching.  During treatment, it is important that you follow these instructions:  Avoid sexual activity or use condoms correctly.  Do not  douche.  Avoid alcohol as directed by your health care provider.  Avoid breastfeeding as directed by your health care provider. SEEK MEDICAL CARE IF:   Your symptoms are not improving after 3 days of treatment.  You have increased discharge or pain.  You have a fever. MAKE SURE YOU:   Understand these instructions.  Will watch your condition.  Will get help right away if you are not doing well or get worse. FOR MORE INFORMATION  Centers for Disease Control and Prevention, Division of STD Prevention: www.cdc.gov/std American Sexual Health Association (ASHA): www.ashastd.org    This information is not intended to replace advice given to you by your health care provider. Make sure you discuss any questions you have with your health care provider.   Document Released: 11/08/2005 Document Revised: 11/29/2014 Document Reviewed: 06/20/2013 Elsevier Interactive Patient Education 2016 [employer]vier Inc.  

## 2016-11-18 ENCOUNTER — Ambulatory Visit (INDEPENDENT_AMBULATORY_CARE_PROVIDER_SITE_OTHER): Payer: BC Managed Care – PPO | Admitting: Women's Health

## 2016-11-18 ENCOUNTER — Encounter: Payer: Self-pay | Admitting: Women's Health

## 2016-11-18 VITALS — BP 117/79 | Ht 64.0 in | Wt 266.0 lb

## 2016-11-18 DIAGNOSIS — N760 Acute vaginitis: Secondary | ICD-10-CM

## 2016-11-18 DIAGNOSIS — B9689 Other specified bacterial agents as the cause of diseases classified elsewhere: Secondary | ICD-10-CM

## 2016-11-18 DIAGNOSIS — Z1329 Encounter for screening for other suspected endocrine disorder: Secondary | ICD-10-CM

## 2016-11-18 DIAGNOSIS — Z1151 Encounter for screening for human papillomavirus (HPV): Secondary | ICD-10-CM

## 2016-11-18 DIAGNOSIS — N898 Other specified noninflammatory disorders of vagina: Secondary | ICD-10-CM

## 2016-11-18 DIAGNOSIS — B373 Candidiasis of vulva and vagina: Secondary | ICD-10-CM

## 2016-11-18 DIAGNOSIS — B3731 Acute candidiasis of vulva and vagina: Secondary | ICD-10-CM

## 2016-11-18 DIAGNOSIS — Z01419 Encounter for gynecological examination (general) (routine) without abnormal findings: Secondary | ICD-10-CM

## 2016-11-18 DIAGNOSIS — Z113 Encounter for screening for infections with a predominantly sexual mode of transmission: Secondary | ICD-10-CM | POA: Diagnosis not present

## 2016-11-18 LAB — CBC WITH DIFFERENTIAL/PLATELET
BASOS ABS: 0 {cells}/uL (ref 0–200)
BASOS PCT: 0 %
EOS PCT: 6 %
Eosinophils Absolute: 408 cells/uL (ref 15–500)
HCT: 43.4 % (ref 35.0–45.0)
HEMOGLOBIN: 13.3 g/dL (ref 11.7–15.5)
LYMPHS ABS: 2788 {cells}/uL (ref 850–3900)
Lymphocytes Relative: 41 %
MCH: 24.7 pg — ABNORMAL LOW (ref 27.0–33.0)
MCHC: 30.6 g/dL — ABNORMAL LOW (ref 32.0–36.0)
MCV: 80.5 fL (ref 80.0–100.0)
MONOS PCT: 12 %
MPV: 9.8 fL (ref 7.5–12.5)
Monocytes Absolute: 816 cells/uL (ref 200–950)
NEUTROS ABS: 2788 {cells}/uL (ref 1500–7800)
Neutrophils Relative %: 41 %
PLATELETS: 309 10*3/uL (ref 140–400)
RBC: 5.39 MIL/uL — AB (ref 3.80–5.10)
RDW: 13.9 % (ref 11.0–15.0)
WBC: 6.8 10*3/uL (ref 3.8–10.8)

## 2016-11-18 LAB — WET PREP FOR TRICH, YEAST, CLUE: TRICH WET PREP: NONE SEEN

## 2016-11-18 LAB — COMPREHENSIVE METABOLIC PANEL
ALBUMIN: 3.7 g/dL (ref 3.6–5.1)
ALT: 17 U/L (ref 6–29)
AST: 15 U/L (ref 10–30)
Alkaline Phosphatase: 63 U/L (ref 33–115)
BILIRUBIN TOTAL: 0.3 mg/dL (ref 0.2–1.2)
BUN: 26 mg/dL — AB (ref 7–25)
CO2: 23 mmol/L (ref 20–31)
CREATININE: 0.86 mg/dL (ref 0.50–1.10)
Calcium: 8.5 mg/dL — ABNORMAL LOW (ref 8.6–10.2)
Chloride: 107 mmol/L (ref 98–110)
Glucose, Bld: 76 mg/dL (ref 65–99)
Potassium: 4.6 mmol/L (ref 3.5–5.3)
SODIUM: 138 mmol/L (ref 135–146)
TOTAL PROTEIN: 6.4 g/dL (ref 6.1–8.1)

## 2016-11-18 LAB — TSH: TSH: 0.64 m[IU]/L

## 2016-11-18 MED ORDER — FLUCONAZOLE 150 MG PO TABS: ORAL_TABLET | ORAL | 1 refills | Status: DC

## 2016-11-18 MED ORDER — METRONIDAZOLE 0.75 % VA GEL: VAGINAL | 3 refills | Status: DC

## 2016-11-18 NOTE — Patient Instructions (Signed)
Vaginal Yeast infection, Adult Vaginal yeast infection is a condition that causes soreness, swelling, and redness (inflammation) of the vagina. It also causes vaginal discharge. This is a common condition. Some women get this infection frequently. What are the causes? This condition is caused by a change in the normal balance of the yeast (candida) and bacteria that live in the vagina. This change causes an overgrowth of yeast, which causes the inflammation. What increases the risk? This condition is more likely to develop in:  Women who take antibiotic medicines.  Women who have diabetes.  Women who take birth control pills.  Women who are pregnant.  Women who douche often.  Women who have a weak defense (immune) system.  Women who have been taking steroid medicines for a long time.  Women who frequently wear tight clothing. What are the signs or symptoms? Symptoms of this condition include:  White, thick vaginal discharge.  Swelling, itching, redness, and irritation of the vagina. The lips of the vagina (vulva) may be affected as well.  Pain or a burning feeling while urinating.  Pain during sex. How is this diagnosed? This condition is diagnosed with a medical history and physical exam. This will include a pelvic exam. Your health care provider will examine a sample of your vaginal discharge under a microscope. Your health care provider may send this sample for testing to confirm the diagnosis. How is this treated? This condition is treated with medicine. Medicines may be over-the-counter or prescription. You may be told to use one or more of the following:  Medicine that is taken orally.  Medicine that is applied as a cream.  Medicine that is inserted directly into the vagina (suppository). Follow these instructions at home:  Take or apply over-the-counter and prescription medicines only as told by your health care provider.  Do not have sex until your health care  provider has approved. Tell your sex partner that you have a yeast infection. That person should go to his or her health care provider if he or she develops symptoms.  Do not wear tight clothes, such as pantyhose or tight pants.  Avoid using tampons until your health care provider approves.  Eat more yogurt. This may help to keep your yeast infection from returning.  Try taking a sitz bath to help with discomfort. This is a warm water bath that is taken while you are sitting down. The water should only come up to your hips and should cover your buttocks. Do this 3-4 times per day or as told by your health care provider.  Do not douche.  Wear breathable, cotton underwear.  If you have diabetes, keep your blood sugar levels under control. Contact a health care provider if:  You have a fever.  Your symptoms go away and then return.  Your symptoms do not get better with treatment.  Your symptoms get worse.  You have new symptoms.  You develop blisters in or around your vagina.  You have blood coming from your vagina and it is not your menstrual period.  You develop pain in your abdomen. This information is not intended to replace advice given to you by your health care provider. Make sure you discuss any questions you have with your health care provider. Document Released: 08/18/2005 Document Revised: 04/21/2016 Document Reviewed: 05/12/2015 Elsevier Interactive Patient Education  2017 [employer] Bacterial Vaginosis Bacterial vaginosis is an infection of the vagina. It happens when too many germs (bacteria) grow in the vagina. This infection puts  you at risk for infections from sex (STIs). Treating this infection can lower your risk for some STIs. You should also treat this if you are pregnant. It can cause your baby to be born early. Follow these instructions at home: Medicines  Take over-the-counter and prescription medicines only as told by your doctor.  Take or use your  antibiotic medicine as told by your doctor. Do not stop taking or using it even if you start to feel better. General instructions  If you your sexual partner is a woman, tell her that you have this infection. She needs to get treatment if she has symptoms. If you have a female partner, he does not need to be treated.  During treatment:  Avoid sex.  Do not douche.  Avoid alcohol as told.  Avoid breastfeeding as told.  Drink enough fluid to keep your pee (urine) clear or pale yellow.  Keep your vagina and butt (rectum) clean.  Wash the area with warm water every day.  Wipe from front to back after you use the toilet.  Keep all follow-up visits as told by your doctor. This is important. Preventing this condition  Do not douche.  Use only warm water to wash around your vagina.  Use protection when you have sex. This includes:  Latex condoms.  Dental dams.  Limit how many people you have sex with. It is best to only have sex with the same person (be monogamous).  Get tested for STIs. Have your partner get tested.  Wear underwear that is cotton or lined with cotton.  Avoid tight pants and pantyhose. This is most important in summer.  Do not use any products that have nicotine or tobacco in them. These include cigarettes and e-cigarettes. If you need help quitting, ask your doctor.  Do not use illegal drugs.  Limit how much alcohol you drink. Contact a doctor if:  Your symptoms do not get better, even after you are treated.  You have more discharge or pain when you pee (urinate).  You have a fever.  You have pain in your belly (abdomen).  You have pain with sex.  Your bleed from your vagina between periods. Summary  This infection happens when too many germs (bacteria) grow in the vagina.  Treating this condition can lower your risk for some infections from sex (STIs).  You should also treat this if you are pregnant. It can cause early (premature)  birth.  Do not stop taking or using your antibiotic medicine even if you start to feel better. This information is not intended to replace advice given to you by your health care provider. Make sure you discuss any questions you have with your health care provider. Document Released: 08/17/2008 Document Revised: 07/24/2016 Document Reviewed: 07/24/2016 Elsevier Interactive Patient Education  2017 [employer] Maintenance, Female Introduction Adopting a healthy lifestyle and getting preventive care can go a long way to promote health and wellness. Talk with your health care provider about what schedule of regular examinations is right for you. This is a good chance for you to check in with your provider about disease prevention and staying healthy. In between checkups, there are plenty of things you can do on your own. Experts have done a lot of research about which lifestyle changes and preventive measures are most likely to keep you healthy. Ask your health care provider for more information. Weight and diet Eat a healthy diet  Be sure to include plenty of vegetables, fruits, low-fat dairy  products, and lean protein.  Do not eat a lot of foods high in solid fats, added sugars, or salt.  Get regular exercise. This is one of the most important things you can do for your health.  Most adults should exercise for at least 150 minutes each week. The exercise should increase your heart rate and make you sweat (moderate-intensity exercise).  Most adults should also do strengthening exercises at least twice a week. This is in addition to the moderate-intensity exercise. Maintain a healthy weight  Body mass index (BMI) is a measurement that can be used to identify possible weight problems. It estimates body fat based on height and weight. Your health care provider can help determine your BMI and help you achieve or maintain a healthy weight.  For females 81 years of age and older:  A BMI  below 18.5 is considered underweight.  A BMI of 18.5 to 24.9 is normal.  A BMI of 25 to 29.9 is considered overweight.  A BMI of 30 and above is considered obese. Watch levels of cholesterol and blood lipids  You should start having your blood tested for lipids and cholesterol at 31 years of age, then have this test every 5 years.  You may need to have your cholesterol levels checked more often if:  Your lipid or cholesterol levels are high.  You are older than 31 years of age.  You are at high risk for heart disease. Cancer screening Lung Cancer  Lung cancer screening is recommended for adults 2-73 years old who are at high risk for lung cancer because of a history of smoking.  A yearly low-dose CT scan of the lungs is recommended for people who:  Currently smoke.  Have quit within the past 15 years.  Have at least a 30-pack-year history of smoking. A pack year is smoking an average of one pack of cigarettes a day for 1 year.  Yearly screening should continue until it has been 15 years since you quit.  Yearly screening should stop if you develop a health problem that would prevent you from having lung cancer treatment. Breast Cancer  Practice breast self-awareness. This means understanding how your breasts normally appear and feel.  It also means doing regular breast self-exams. Let your health care provider know about any changes, no matter how small.  If you are in your 20s or 30s, you should have a clinical breast exam (CBE) by a health care provider every 1-3 years as part of a regular health exam.  If you are 45 or older, have a CBE every year. Also consider having a breast X-ray (mammogram) every year.  If you have a family history of breast cancer, talk to your health care provider about genetic screening.  If you are at high risk for breast cancer, talk to your health care provider about having an MRI and a mammogram every year.  Breast cancer gene (BRCA)  assessment is recommended for women who have family members with BRCA-related cancers. BRCA-related cancers include:  Breast.  Ovarian.  Tubal.  Peritoneal cancers.  Results of the assessment will determine the need for genetic counseling and BRCA1 and BRCA2 testing. Cervical Cancer  Your health care provider may recommend that you be screened regularly for cancer of the pelvic organs (ovaries, uterus, and vagina). This screening involves a pelvic examination, including checking for microscopic changes to the surface of your cervix (Pap test). You may be encouraged to have this screening done every 3 years, beginning  at age 74.  For women ages 61-65, health care providers may recommend pelvic exams and Pap testing every 3 years, or they may recommend the Pap and pelvic exam, combined with testing for human papilloma virus (HPV), every 5 years. Some types of HPV increase your risk of cervical cancer. Testing for HPV may also be done on women of any age with unclear Pap test results.  Other health care providers may not recommend any screening for nonpregnant women who are considered low risk for pelvic cancer and who do not have symptoms. Ask your health care provider if a screening pelvic exam is right for you.  If you have had past treatment for cervical cancer or a condition that could lead to cancer, you need Pap tests and screening for cancer for at least 20 years after your treatment. If Pap tests have been discontinued, your risk factors (such as having a new sexual partner) need to be reassessed to determine if screening should resume. Some women have medical problems that increase the chance of getting cervical cancer. In these cases, your health care provider may recommend more frequent screening and Pap tests. Colorectal Cancer  This type of cancer can be detected and often prevented.  Routine colorectal cancer screening usually begins at 31 years of age and continues through 31  years of age.  Your health care provider may recommend screening at an earlier age if you have risk factors for colon cancer.  Your health care provider may also recommend using home test kits to check for hidden blood in the stool.  A small camera at the end of a tube can be used to examine your colon directly (sigmoidoscopy or colonoscopy). This is done to check for the earliest forms of colorectal cancer.  Routine screening usually begins at age 25.  Direct examination of the colon should be repeated every 5-10 years through 31 years of age. However, you may need to be screened more often if early forms of precancerous polyps or small growths are found. Skin Cancer  Check your skin from head to toe regularly.  Tell your health care provider about any new moles or changes in moles, especially if there is a change in a mole's shape or color.  Also tell your health care provider if you have a mole that is larger than the size of a pencil eraser.  Always use sunscreen. Apply sunscreen liberally and repeatedly throughout the day.  Protect yourself by wearing long sleeves, pants, a wide-brimmed hat, and sunglasses whenever you are outside. Heart disease, diabetes, and high blood pressure  High blood pressure causes heart disease and increases the risk of stroke. High blood pressure is more likely to develop in:  People who have blood pressure in the high end of the normal range (130-139/85-89 mm Hg).  People who are overweight or obese.  People who are African American.  If you are 55-26 years of age, have your blood pressure checked every 3-5 years. If you are 44 years of age or older, have your blood pressure checked every year. You should have your blood pressure measured twice-once when you are at a hospital or clinic, and once when you are not at a hospital or clinic. Record the average of the two measurements. To check your blood pressure when you are not at a hospital or clinic,  you can use:  An automated blood pressure machine at a pharmacy.  A home blood pressure monitor.  If you are between 2  years and 79 years old, ask your health care provider if you should take aspirin to prevent strokes.  Have regular diabetes screenings. This involves taking a blood sample to check your fasting blood sugar level.  If you are at a normal weight and have a low risk for diabetes, have this test once every three years after 31 years of age.  If you are overweight and have a high risk for diabetes, consider being tested at a younger age or more often. Preventing infection Hepatitis B  If you have a higher risk for hepatitis B, you should be screened for this virus. You are considered at high risk for hepatitis B if:  You were born in a country where hepatitis B is common. Ask your health care provider which countries are considered high risk.  Your parents were born in a high-risk country, and you have not been immunized against hepatitis B (hepatitis B vaccine).  You have HIV or AIDS.  You use needles to inject street drugs.  You live with someone who has hepatitis B.  You have had sex with someone who has hepatitis B.  You get hemodialysis treatment.  You take certain medicines for conditions, including cancer, organ transplantation, and autoimmune conditions. Hepatitis C  Blood testing is recommended for:  Everyone born from [birth year] through 1966.  Anyone with known risk factors for hepatitis C. Sexually transmitted infections (STIs)  You should be screened for sexually transmitted infections (STIs) including gonorrhea and chlamydia if:  You are sexually active and are younger than 31 years of age.  You are older than 31 years of age and your health care provider tells you that you are at risk for this type of infection.  Your sexual activity has changed since you were last screened and you are at an increased risk for chlamydia or gonorrhea. Ask your  health care provider if you are at risk.  If you do not have HIV, but are at risk, it may be recommended that you take a prescription medicine daily to prevent HIV infection. This is called pre-exposure prophylaxis (PrEP). You are considered at risk if:  You are sexually active and do not regularly use condoms or know the HIV status of your partner(s).  You take drugs by injection.  You are sexually active with a partner who has HIV. Talk with your health care provider about whether you are at high risk of being infected with HIV. If you choose to begin PrEP, you should first be tested for HIV. You should then be tested every 3 months for as long as you are taking PrEP. Pregnancy  If you are premenopausal and you may become pregnant, ask your health care provider about preconception counseling.  If you may become pregnant, take 400 to 800 micrograms (mcg) of folic acid every day.  If you want to prevent pregnancy, talk to your health care provider about birth control (contraception). Osteoporosis and menopause  Osteoporosis is a disease in which the bones lose minerals and strength with aging. This can result in serious bone fractures. Your risk for osteoporosis can be identified using a bone density scan.  If you are 58 years of age or older, or if you are at risk for osteoporosis and fractures, ask your health care provider if you should be screened.  Ask your health care provider whether you should take a calcium or vitamin D supplement to lower your risk for osteoporosis.  Menopause may have certain physical symptoms and risks.  Hormone replacement therapy may reduce some of these symptoms and risks. Talk to your health care provider about whether hormone replacement therapy is right for you. Follow these instructions at home:  Schedule regular health, dental, and eye exams.  Stay current with your immunizations.  Do not use any tobacco products including cigarettes, chewing  tobacco, or electronic cigarettes.  If you are pregnant, do not drink alcohol.  If you are breastfeeding, limit how much and how often you drink alcohol.  Limit alcohol intake to no more than 1 drink per day for nonpregnant women. One drink equals 12 ounces of beer, 5 ounces of wine, or 1 ounces of hard liquor.  Do not use street drugs.  Do not share needles.  Ask your health care provider for help if you need support or information about quitting drugs.  Tell your health care provider if you often feel depressed.  Tell your health care provider if you have ever been abused or do not feel safe at home. This information is not intended to replace advice given to you by your health care provider. Make sure you discuss any questions you have with your health care provider. Document Released: 05/24/2011 Document Revised: 04/15/2016 Document Reviewed: 08/12/2015  2017 Elsevier

## 2016-11-18 NOTE — Progress Notes (Signed)
Melissa ArbourMelanie M Greene [birth year] [id number]    History:    Presents for annual exam. Monthly cycle/condoms currently not sexually active. 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 Gardasil series completed. Has had problems with recurrent BV using half applicator of MetroGel after cycles with good relief of symptoms. 30 pound weight gain in the past year contemplating weight loss surgery.  Past medical history, past surgical history, family history and social history were all reviewed and documented in the EPIC chart. 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 Father deceased from heart disease and hypertension. [place]  ROS:  A ROS was performed and pertinent positives and negatives are included.  Exam:  Vitals:   11/18/16 1408  BP: 117/79  Weight: 266 lb (120.7 kg)  Height: 5\' 4"  (1.626 m)   Body mass index is 45.66 kg/m.   General appearance:  Normal Thyroid:  Symmetrical, normal in size, without palpable masses or nodularity. Respiratory  Auscultation:  Clear without wheezing or rhonchi Cardiovascular  Auscultation:  Regular rate, without rubs, murmurs or gallops  Edema/varicosities:  Not grossly evident Abdominal  Soft,nontender, without masses, guarding or rebound.  Liver/spleen:  No organomegaly noted  Hernia:  None appreciated  Skin  Inspection:  Grossly normal   Breasts: Examined lying and sitting.     Right: Without masses, retractions, discharge or axillary adenopathy.     Left: Without masses, retractions, discharge or axillary adenopathy. Gentitourinary   Inguinal/mons:  Normal without inguinal adenopathy  External genitalia:  Normal  BUS/Urethra/Skene's glands:  Normal  Vagina:  Normal Moderate white discharge wet prep positive for few yeast, many clues, many bacteria  Cervix:  Normal  Uterus:   normal in size, shape and contour.  Midline and mobile  Adnexa/parametria:     Rt: Without masses or tenderness.   Lt: Without masses or tenderness.  Anus and  perineum: Normal  Digital rectal exam: Normal sphincter tone without palpated masses or tenderness  Assessment/Plan:  30 y.o. SBF G0 for annual exam.  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 Reviewed importance of decreasing calories and increasing exercise for weight loss and health. Requests referral to surgeon to discuss weight loss gastric sleeve surgery. MetroGel vaginal cream 1 applicator at bedtime 5, alcohol precautions reviewed, instructed to call if no relief of symptoms. Diflucan 150 by mouth today repeat in 3 days if needed. SBE's, exercise, MVI daily encouraged. CBC, CMP, TSH, UA, Pap with HR HPV typing, GC/Chlamydia, HIV, hep B, C, RPRHarrington Challenger.     YOUNG,NANCY J WHNP, 2:39 PM 11/18/2016

## 2016-11-18 NOTE — Addendum Note (Signed)
Addended by: Aura CampsWEBB, JENNIFER L on: 11/18/2016 02:55 PM   Modules accepted: Orders

## 2016-11-19 LAB — URINALYSIS W MICROSCOPIC + REFLEX CULTURE
Bacteria, UA: NONE SEEN [HPF]
Bilirubin Urine: NEGATIVE
CASTS: NONE SEEN [LPF]
CRYSTALS: NONE SEEN [HPF]
Glucose, UA: NEGATIVE
KETONES UR: NEGATIVE
Leukocytes, UA: NEGATIVE
NITRITE: NEGATIVE
PH: 6 (ref 5.0–8.0)
Protein, ur: NEGATIVE
SPECIFIC GRAVITY, URINE: 1.031 (ref 1.001–1.035)
Squamous Epithelial / LPF: NONE SEEN [HPF] (ref <=5)
WBC, UA: NONE SEEN WBC/HPF (ref <=5)
YEAST: NONE SEEN [HPF]

## 2016-11-19 LAB — PAP, TP IMAGING W/ HPV RNA, RFLX HPV TYPE 16,18/45: HPV mRNA, High Risk: NOT DETECTED

## 2016-11-19 LAB — RPR

## 2016-11-19 LAB — HIV ANTIBODY (ROUTINE TESTING W REFLEX): HIV 1&2 Ab, 4th Generation: NONREACTIVE

## 2016-11-19 LAB — GC/CHLAMYDIA PROBE AMP
CT Probe RNA: NOT DETECTED
GC PROBE AMP APTIMA: NOT DETECTED

## 2016-11-19 LAB — HEPATITIS C ANTIBODY: HCV AB: NEGATIVE

## 2016-11-19 LAB — HEPATITIS B SURFACE ANTIGEN: HEP B S AG: NEGATIVE

## 2016-11-20 LAB — URINE CULTURE: Organism ID, Bacteria: NO GROWTH

## 2016-11-23 ENCOUNTER — Telehealth: Payer: Self-pay | Admitting: *Deleted

## 2016-11-23 NOTE — Telephone Encounter (Signed)
Referral faxed to [facility] [place] [facility] they will fax me back with time and date to relay to patient. 

## 2016-11-23 NOTE — Telephone Encounter (Signed)
-----   Message from Harrington ChallengerNancy J Young, NP sent at 11/18/2016  2:28 PM EST ----- Would like surgical consult to discuss wt loss surgery, has attended wt loss class   Teacher, late day best

## 2016-12-13 NOTE — Telephone Encounter (Signed)
I spoke with referral coordinator and was informed patient will need to contact wt loss seminar before appointment with surgeon [phone], I gave patient the phone # to call and schedule, after the class the wt loss center will tell her the next process with referral. Pt verbalized.

## 2017-09-30 ENCOUNTER — Ambulatory Visit: Payer: BC Managed Care – PPO | Admitting: Gynecology

## 2017-09-30 ENCOUNTER — Encounter: Payer: Self-pay | Admitting: Gynecology

## 2017-09-30 VITALS — BP 120/78

## 2017-09-30 DIAGNOSIS — Z3009 Encounter for other general counseling and advice on contraception: Secondary | ICD-10-CM

## 2017-09-30 DIAGNOSIS — N898 Other specified noninflammatory disorders of vagina: Secondary | ICD-10-CM | POA: Diagnosis not present

## 2017-09-30 LAB — WET PREP FOR TRICH, YEAST, CLUE

## 2017-09-30 MED ORDER — METRONIDAZOLE 500 MG PO TABS: 500.0000 mg | ORAL_TABLET | Freq: Two times a day (BID) | ORAL | 0 refills | Status: DC

## 2017-09-30 MED ORDER — NORETHINDRONE ACET-ETHINYL EST 1-20 MG-MCG PO TABS: 1.0000 | ORAL_TABLET | Freq: Every day | ORAL | 2 refills | Status: DC

## 2017-09-30 NOTE — Patient Instructions (Signed)
Take the Flagyl medication twice daily for 7 days.  Avoid alcohol while taking.  Start on the birth control pills the Sunday following her next period.  Use backup contraception such as condoms for the first pack.  Follow-up for annual exam in January.

## 2017-09-30 NOTE — Addendum Note (Signed)
Addended by: Dayna BarkerGARDNER, Edison Nicholson K on: 09/30/2017 01:04 PM   Modules accepted: Orders

## 2017-09-30 NOTE — Progress Notes (Signed)
    Melissa ArbourMelanie M Greene [birth year] [id number]        31 y.o.  G0P0 presents with 2 issues:  1. Vaginal discharge with slight irritation over the last week or so.  No odor.  No urinary symptoms such as frequency dysuria urgency low back pain fever or chills. 2. Wants to discuss birth control options.  Had been on oral contraceptives, patches and NuvaRing in the past.  Past medical history,surgical history, problem list, medications, allergies, family history and social history were all reviewed and documented in the EPIC chart.  Directed ROS with pertinent positives and negatives documented in the history of present illness/assessment and plan.  Exam: Kennon PortelaKim Greene assistant Vitals:   09/30/17 1211  BP: 120/78   General appearance:  Normal External BUS vagina with white discharge.  Cervix normal.  Uterus grossly normal size midline mobile nontender.  Adnexa without masses or tenderness.  Assessment/Plan:  31 y.o. G0P0 with:  1. Vaginal discharge with irritation.  Exam and wet prep consistent with bacterial vaginosis.  Options for treatment reviewed to include Flagyl versus vaginal cream.  Patient elects for Flagyl 500 mg twice daily times 7 days.  Alcohol avoidance reviewed. 2. Contraceptive management.  I reviewed contraceptive options to include pill, patch, ring, progesterone only such as Depo-Lupron/Nexplanon and IUDs.  Patient wants to start birth control pills.  Loestrin 1/20 equivalent prescribed with Sunday start after menses, backup contraception first pack.  She is due for her annual exam and I refilled her for a 6470-month supply only to allow her to get in for an annual exam and an annual refill of the birth control pills.    Dara LordsFONTAINE,Dmitri Pettigrew P MD, 12:39 PM 09/30/2017

## 2018-02-28 ENCOUNTER — Other Ambulatory Visit (HOSPITAL_COMMUNITY): Payer: Self-pay | Admitting: General Surgery

## 2018-03-20 ENCOUNTER — Encounter: Payer: BC Managed Care – PPO | Attending: General Surgery | Admitting: Skilled Nursing Facility1

## 2018-03-20 ENCOUNTER — Encounter: Payer: Self-pay | Admitting: Skilled Nursing Facility1

## 2018-03-20 DIAGNOSIS — Z713 Dietary counseling and surveillance: Secondary | ICD-10-CM | POA: Insufficient documentation

## 2018-03-20 DIAGNOSIS — Z6841 Body Mass Index (BMI) 40.0 and over, adult: Secondary | ICD-10-CM | POA: Insufficient documentation

## 2018-03-20 NOTE — Progress Notes (Signed)
Pre-Op Assessment Visit:  Pre-Operative Sleeve Gastrectomy Surgery  Medical Nutrition Therapy:  Appt start time: 3:30  End time:  4:45  Patient was seen on 03/20/2018 for Pre-Operative Nutrition Assessment. Assessment and letter of approval faxed to [facility] Bariatric Surgery Program coordinator on 03/20/2018.   Pt states she is currently doing weight watchers. Pt states she used alcohol to fall asleep because she is uncomfortable with idle time so she would rather come home after work and go to sleep rather than have nothing to do stating she sge no longer does this and uses sleep medication OTC instead. Pt states she does not go anywhere because she is fat. Pt states her psychologist said in so many words in a phone conversation not to discuss any mental health issues as they may lead to a slower pathway to surgery: pt does not remember her psychologist and states she is in [place]. Pt states she wants to lose 20 pounds before having the surgery.  Dietitian discussed the mental involvement needed for success in surgery and discussed the pt exploring her relationship with herself and food. Pt states she is open to working with a mental health professional stating she knows she needs to but has not sought help because she was so afraid of her insurance company being able to read the note: dietitian reassured her insurance company would not have access to that information without her consent so pt was comfortable with finding a mental health professional hearing that.   Pt expectation of surgery: you cannot eat, you cannot eat just to eat, I can be the old Bobbiejo again  Pt expectation of Dietitian: help with what and how to eat  Start weight at [facility]: 284.8 BMI: 48.13  24 hr Dietary Recall: First Meal: skipped Snack:  Second Meal: skipped or fast food Snack: pop tart or honey bun Third Meal: fast food  Snack: cereal Beverages: soda  Encouraged to engage in 150 minutes of  moderate physical activity including cardiovascular and weight baring weekly  Handouts given during visit include:  . Pre-Op Goals . Bariatric Surgery Protein Shakes To discuss next time: General healthy eating and why no alcohol . Maintain or lose weight as instructed by your surgeon . Make healthy food choices . Begin to limit portion sizes . Limited concentrated sugars and fried foods . Keep fat/sugar in the single digits per serving on             food labels . Practice CHEWING your food  (aim for 30 chews per bite or until applesauce consistency) . Practice not drinking 15 minutes before, during, and 30 minutes after each meal/snack . Avoid all carbonated beverages  . Avoid/limit caffeinated beverages  . Avoid all sugar-sweetened beverages . Consume 3 meals per day; eat every 3-5 hours . Make a list of non-food related activities . Aim for 64-100 ounces of FLUID daily  . Aim for at least 60-80 grams of PROTEIN daily . Look for a liquid protein source that contain ?15 g protein and ?5 g carbohydrate  (ex: shakes, drinks, shots)  -Follow diet recommendations listed below   Energy and Macronutrient Recomendations: Calories: 1600 Carbohydrate: 180 Protein: 120 Fat: 44  Demonstrated degree of understanding via:  Teach Back  Teaching Method Utilized:  Visual Auditory Hands on  Barriers to learning/adherence to lifestyle change:   Patient to call the [facility] to enroll in Pre-Op and Post-Op Nutrition Education when surgery date is scheduled.

## 2018-03-20 NOTE — Patient Instructions (Signed)
[email or web address] [phone])   Eat every 3 hours starting with 1 to 1.5 hours after waking

## 2018-04-03 ENCOUNTER — Encounter: Payer: Self-pay | Admitting: Skilled Nursing Facility1

## 2018-04-03 ENCOUNTER — Encounter: Payer: BC Managed Care – PPO | Attending: General Surgery | Admitting: Skilled Nursing Facility1

## 2018-04-03 DIAGNOSIS — Z6841 Body Mass Index (BMI) 40.0 and over, adult: Secondary | ICD-10-CM | POA: Diagnosis not present

## 2018-04-03 DIAGNOSIS — Z713 Dietary counseling and surveillance: Secondary | ICD-10-CM | POA: Insufficient documentation

## 2018-04-03 NOTE — Progress Notes (Signed)
Sleeve Assessment:   1st SW[facility] Appointment.   Pt returned to complete her assessment education.   Start weight at [facility]: 284.8 Wt: 284.3 BMI: 48.13  MEDICATIONS: See List   DIETARY INTAKE:  24 hr Dietary Recall: First Meal: skipped Snack:  Second Meal: skipped or fast food Snack: pop tart or honey bun Third Meal: fast food  Snack: cereal Beverages: soda  Usual physical activity: ADL's  -Follow diet recommendations listed below  Energy and Macronutrient Recomendations: Calories: 1600 Carbohydrate: 180 Protein: 120 Fat: 44 Nutritional Diagnosis:  [place]-3.3 Overweight/obesity related to past poor dietary habits and physical inactivity as evidenced by patient w/ planned sleeve surgery following dietary guidelines for continued weight loss.    Intervention:  Nutrition counseling for upcoming Bariatric Surgery. Goals: -Encouraged to engage in 150 minutes of moderate physical activity including cardiovascular and weight baring weekly -Eat well balanced meals -Chew until applesauce consistency -Avoid alcohol    Teaching Method Utilized:  Visual Auditory Hands on  Barriers to learning/adherence to lifestyle change: none identified  Demonstrated degree of understanding via:  Teach Back   Monitoring/Evaluation:  Dietary intake, exercise,and body weight prn.

## 2018-05-01 ENCOUNTER — Encounter: Payer: BC Managed Care – PPO | Attending: General Surgery | Admitting: Skilled Nursing Facility1

## 2018-05-01 DIAGNOSIS — Z713 Dietary counseling and surveillance: Secondary | ICD-10-CM | POA: Insufficient documentation

## 2018-05-01 DIAGNOSIS — Z6841 Body Mass Index (BMI) 40.0 and over, adult: Secondary | ICD-10-CM | POA: Insufficient documentation

## 2018-05-03 ENCOUNTER — Encounter: Payer: Self-pay | Admitting: Skilled Nursing Facility1

## 2018-05-03 NOTE — Progress Notes (Signed)
Pre-Operative Nutrition Class:  Appt start time: 1730   End time:  1830.  Patient was seen on 05/01/2018 for Pre-Operative Bariatric Surgery Education at [facility].   Surgery date:  Surgery type: sleeve Start weight at [facility]: 284.8 Weight today: 288  Samples given per MNT protocol. Patient educated on appropriate usage: Bariatric Advantage Multivitamin Lot # [id number] Exp: 10/20  Bariatric Advantage Calcium  Lot # [id number] Exp: oct-05-2018  Unjury Protein  Shake Lot # [id number] Exp: oct-13-19   The following the learning objectives were met by the patient during this course:  Identify Pre-Op Dietary Goals and will begin 2 weeks pre-operatively  Identify appropriate sources of fluids and proteins   State protein recommendations and appropriate sources pre and post-operatively  Identify Post-Operative Dietary Goals and will follow for 2 weeks post-operatively  Identify appropriate multivitamin and calcium sources  Describe the need for physical activity post-operatively and will follow MD recommendations  State when to call healthcare provider regarding medication questions or post-operative complications  Handouts given during class include:  Pre-Op Bariatric Surgery Diet Handout  Protein Shake Handout  Post-Op Bariatric Surgery Nutrition Handout  BELT Program Information Flyer  Support Group Information Flyer  [facility] Bariatric Supplements Price List  Follow-Up Plan: Patient will follow-up at [facility] 2 weeks post operatively for diet advancement per MD.   

## 2018-05-20 ENCOUNTER — Ambulatory Visit (INDEPENDENT_AMBULATORY_CARE_PROVIDER_SITE_OTHER): Payer: BC Managed Care – PPO

## 2018-05-20 ENCOUNTER — Ambulatory Visit: Payer: BC Managed Care – PPO | Admitting: Podiatry

## 2018-05-20 ENCOUNTER — Encounter: Payer: Self-pay | Admitting: Podiatry

## 2018-05-20 DIAGNOSIS — M779 Enthesopathy, unspecified: Secondary | ICD-10-CM

## 2018-05-20 DIAGNOSIS — M216X9 Other acquired deformities of unspecified foot: Secondary | ICD-10-CM

## 2018-05-20 DIAGNOSIS — Q828 Other specified congenital malformations of skin: Secondary | ICD-10-CM

## 2018-05-20 NOTE — Progress Notes (Signed)
Subjective:    Patient ID: Melissa Greene, female    DOB: 1986/04/19, 32 y.o.   MRN: [id number]  HPI 32 year old female presents the office today for concerns of thick, painful calluses to both of her feet.  She states it was on the right side worse but recently the left side started become more painful.  She did see Dr. Ralene Cork for this previously.  Recently she is been followed with Dr. Maisie Fus she has had injections performed which is been helpful in the right side but not on the left side.  She states that she is to be scheduled for surgery in the left side to help decrease the pressure to the callus by "raising the bone up".  She has had another opinion before undergoing surgery.  She denies any drainage or crusting to the area she denies any recent injury or trauma.  There is been a chronic issue for her getting worse recently.   Review of Systems  All other systems reviewed and are negative.  Past Medical History:  Diagnosis Date  . Allergy   . Asthma     Past Surgical History:  Procedure Laterality Date  . COSMETIC SURGERY  2012  . SKIN LESION EXCISION Right    Foot     Current Outpatient Medications:  .  albuterol (PROVENTIL HFA;VENTOLIN HFA) 108 (90 BASE) MCG/ACT inhaler, Inhale 2 puffs into the lungs every 4 (four) hours as needed for wheezing or shortness of breath (cough, shortness of breath or wheezing.). (Patient not taking: Reported on 05/27/2015), Disp: 1 Inhaler, Rfl: 5 .  ciprofloxacin (CIPRO) 500 MG tablet, Take 500 mg 2 (two) times daily by mouth., Disp: , Rfl:  .  fluconazole (DIFLUCAN) 150 MG tablet, Take one today repeat in 3 days (Patient not taking: Reported on 09/30/2017), Disp: 2 tablet, Rfl: 1 .  Guaifenesin (MUCINEX MAXIMUM STRENGTH) 1200 MG TB12, Take 1 tablet (1,200 mg total) by mouth every 12 (twelve) hours as needed. (Patient not taking: Reported on 05/27/2015), Disp: 14 tablet, Rfl: 1 .  ipratropium (ATROVENT) 0.03 % nasal spray, Place 2 sprays into both  nostrils 2 (two) times daily. (Patient not taking: Reported on 05/27/2015), Disp: 30 mL, Rfl: 0 .  metroNIDAZOLE (FLAGYL) 500 MG tablet, Take 1 tablet (500 mg total) 2 (two) times daily by mouth. For 7 days.  Avoid alcohol while taking, Disp: 14 tablet, Rfl: 0 .  metroNIDAZOLE (METROGEL VAGINAL) 0.75 % vaginal gel, 1 applicator per vagina at HS x 5 (Patient not taking: Reported on 09/30/2017), Disp: 70 g, Rfl: 3 .  mometasone-formoterol (DULERA) 100-5 MCG/ACT AERO, Inhale 2 puffs into the lungs 2 (two) times daily. (Patient not taking: Reported on 05/27/2015), Disp: 1 Inhaler, Rfl: 5 .  norethindrone-ethinyl estradiol (MICROGESTIN,JUNEL,LOESTRIN) 1-20 MG-MCG tablet, Take 1 tablet daily by mouth., Disp: 1 Package, Rfl: 2  Allergies  Allergen Reactions  . Ceclor [Cefaclor] Swelling  . Penicillins Swelling         Objective:   Physical Exam  General: AAO x3, NAD  Dermatological: Thick hyperkeratotic lesion left foot submetatarsal 4.  Upon review there is no underlying ulceration, drainage or signs of infection appears to be a deep porokeratosis.  Also hyperkeratotic lesion right foot the metatarsal 1, 3, 5.  No underlying ulceration or signs of infection.  Vascular: Dorsalis Pedis artery and Posterior Tibial artery pedal pulses are 2/4 bilateral with immedate capillary fill time.There is no pain with calf compression, swelling, warmth, erythema.   Neruologic: Grossly intact via  light touch bilateral.  Protective threshold with Semmes Wienstein monofilament intact to all pedal sites bilateral.  Musculoskeletal: Tenderness the hyperkeratotic lesions but no other areas of tenderness there is no area pinpoint bony tenderness.   Gait: Unassisted, Nonantalgic.      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 -Treatment options discussed including all alternatives, risks, and complications -Etiology of  symptoms were discussed -X-rays were obtained and reviewed with the patient. Skin markers were trying to identify the area the skin lesions however they did not move.  There is no evidence of acute fracture identified bilaterally.  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 -She is requesting callus to be trimmed today.  This should be debrided without any complications or bleeding.  The left side appear to be thicker deeper.  I sharply debrided without any complications or bleeding.  A pad was placed after the area was cleaned with alcohol and followed by salicylic acid and a bandage.  Post procedure instructions were discussed.  Monitoring signs or symptoms of infection -We did discussion regards to both conservative as well as surgical options.  Surgical intervention I do think may help her given the pain that she is been having however I discussed the risks of doing surgery for this.  Is a chance that there may not help her transfer lesions.  He does not think she is having she was to undergo surgery.  She would like to follow back up with her other doctor for this.  Vivi BarrackMatthew R Corneluis Allston DPM

## 2018-08-31 ENCOUNTER — Other Ambulatory Visit: Payer: Self-pay | Admitting: Gynecology

## 2018-10-09 ENCOUNTER — Other Ambulatory Visit: Payer: Self-pay | Admitting: Surgical Oncology

## 2018-11-13 ENCOUNTER — Ambulatory Visit
Admission: RE | Admit: 2018-11-13 | Discharge: 2018-11-13 | Disposition: A | Discharge status: Home / Self Care | Payer: BC Managed Care – PPO | Source: Ambulatory Visit | Attending: Surgical Oncology | Admitting: Surgical Oncology

## 2018-11-27 ENCOUNTER — Encounter: Payer: Self-pay | Admitting: Women's Health

## 2018-11-27 ENCOUNTER — Ambulatory Visit: Payer: BC Managed Care – PPO | Admitting: Women's Health

## 2018-11-27 VITALS — BP 124/78

## 2018-11-27 DIAGNOSIS — N898 Other specified noninflammatory disorders of vagina: Secondary | ICD-10-CM | POA: Diagnosis not present

## 2018-11-27 LAB — WET PREP FOR TRICH, YEAST, CLUE

## 2018-11-27 MED ORDER — FLUCONAZOLE 150 MG PO TABS: 150.0000 mg | ORAL_TABLET | Freq: Once | ORAL | 1 refills | Status: AC

## 2018-11-27 NOTE — Progress Notes (Signed)
33 year old SBF G0 presents with complaint of increased vaginal discharge with itching and occasional odor for the past month.  Regular monthly cycle condoms for contraception.  Has not been sexually active in greater than 6 months, last STD screen 2017.  Denies abdominal/back pain, urinary symptoms, or fever.  In the past year completed an MBA.  High school math teacher.  Exam: Appears well, obese.  No CVAT.  Abdomen soft, nontender, external genitalia within normal limits, speculum exam scant white discharge, minimal erythema, no odor noted, wet prep positive for yeast, GC/chlamydia culture taken.  Bimanual no CMT or adnexal tenderness.  Yeast vaginitis  Plan: Diflucan 150 p.o. x1 dose with refill.  Yeast prevention discussed.  Instructed to call if no relief.  Instructed to schedule annual exam.

## 2018-11-27 NOTE — Patient Instructions (Signed)
Vaginal Yeast infection, Adult    Vaginal yeast infection is a condition that causes vaginal discharge as well as soreness, swelling, and redness (inflammation) of the vagina. This is a common condition. Some women get this infection frequently.  What are the causes?  This condition is caused by a change in the normal balance of the yeast (candida) and bacteria that live in the vagina. This change causes an overgrowth of yeast, which causes the inflammation.  What increases the risk?  The condition is more likely to develop in women who:   Take antibiotic medicines.   Have diabetes.   Take birth control pills.   Are pregnant.   Douche often.   Have a weak body defense system (immune system).   Have been taking steroid medicines for a long time.   Frequently wear tight clothing.  What are the signs or symptoms?  Symptoms of this condition include:   White, thick, creamy vaginal discharge.   Swelling, itching, redness, and irritation of the vagina. The lips of the vagina (vulva) may be affected as well.   Pain or a burning feeling while urinating.   Pain during sex.  How is this diagnosed?  This condition is diagnosed based on:   Your medical history.   A physical exam.   A pelvic exam. Your health care provider will examine a sample of your vaginal discharge under a microscope. Your health care provider may send this sample for testing to confirm the diagnosis.  How is this treated?  This condition is treated with medicine. Medicines may be over-the-counter or prescription. You may be told to use one or more of the following:   Medicine that is taken by mouth (orally).   Medicine that is applied as a cream (topically).   Medicine that is inserted directly into the vagina (suppository).  Follow these instructions at home:    Lifestyle   Do not have sex until your health care provider approves. Tell your sex partner that you have a yeast infection. That person should go to his or her health care  provider and ask if they should also be treated.   Do not wear tight clothes, such as pantyhose or tight pants.   Wear breathable cotton underwear.  General instructions   Take or apply over-the-counter and prescription medicines only as told by your health care provider.   Eat more yogurt. This may help to keep your yeast infection from returning.   Do not use tampons until your health care provider approves.   Try taking a sitz bath to help with discomfort. This is a warm water bath that is taken while you are sitting down. The water should only come up to your hips and should cover your buttocks. Do this 3-4 times per day or as told by your health care provider.   Do not douche.   If you have diabetes, keep your blood sugar levels under control.   Keep all follow-up visits as told by your health care provider. This is important.  Contact a health care provider if:   You have a fever.   Your symptoms go away and then return.   Your symptoms do not get better with treatment.   Your symptoms get worse.   You have new symptoms.   You develop blisters in or around your vagina.   You have blood coming from your vagina and it is not your menstrual period.   You develop pain in your abdomen.  Summary     Vaginal yeast infection is a condition that causes discharge as well as soreness, swelling, and redness (inflammation) of the vagina.   This condition is treated with medicine. Medicines may be over-the-counter or prescription.   Take or apply over-the-counter and prescription medicines only as told by your health care provider.   Do not douche. Do not have sex or use tampons until your health care provider approves.   Contact a health care provider if your symptoms do not get better with treatment or your symptoms go away and then return.  This information is not intended to replace advice given to you by your health care provider. Make sure you discuss any questions you have with your health care  provider.  Document Released: 08/18/2005 Document Revised: 03/27/2018 Document Reviewed: 03/27/2018  [employer]vier Interactive Patient Education  2019 [employer]

## 2018-11-28 LAB — C. TRACHOMATIS/N. GONORRHOEAE RNA
C. trachomatis RNA, TMA: DETECTED — AB
N. GONORRHOEAE RNA, TMA: NOT DETECTED

## 2018-11-29 ENCOUNTER — Other Ambulatory Visit: Payer: Self-pay | Admitting: Women's Health

## 2018-11-29 MED ORDER — AZITHROMYCIN 500 MG PO TABS: 1000.0000 mg | ORAL_TABLET | Freq: Once | ORAL | 0 refills | Status: AC

## 2019-01-08 ENCOUNTER — Ambulatory Visit (INDEPENDENT_AMBULATORY_CARE_PROVIDER_SITE_OTHER): Payer: BC Managed Care – PPO | Admitting: Women's Health

## 2019-01-08 ENCOUNTER — Encounter: Payer: Self-pay | Admitting: Women's Health

## 2019-01-08 VITALS — BP 132/80 | Ht 64.0 in | Wt 304.0 lb

## 2019-01-08 DIAGNOSIS — Z01419 Encounter for gynecological examination (general) (routine) without abnormal findings: Secondary | ICD-10-CM | POA: Diagnosis not present

## 2019-01-08 DIAGNOSIS — Z113 Encounter for screening for infections with a predominantly sexual mode of transmission: Secondary | ICD-10-CM

## 2019-01-08 DIAGNOSIS — N898 Other specified noninflammatory disorders of vagina: Secondary | ICD-10-CM | POA: Diagnosis not present

## 2019-01-08 LAB — WET PREP FOR TRICH, YEAST, CLUE

## 2019-01-08 NOTE — Patient Instructions (Addendum)
[employer]  Boric acid gel caps twice weekly per vagina   Carbohydrate Counting for Diabetes Mellitus, Adult  Carbohydrate counting is a method of keeping track of how many carbohydrates you eat. Eating carbohydrates naturally increases the amount of sugar (glucose) in the blood. Counting how many carbohydrates you eat helps keep your blood glucose within normal limits, which helps you manage your diabetes (diabetes mellitus). It is important to know how many carbohydrates you can safely have in each meal. This is different for every person. A diet and nutrition specialist (registered dietitian) can help you make a meal plan and calculate how many carbohydrates you should have at each meal and snack. Carbohydrates are found in the following foods:  Grains, such as breads and cereals.  Dried beans and soy products.  Starchy vegetables, such as potatoes, peas, and corn.  Fruit and fruit juices.  Milk and yogurt.  Sweets and snack foods, such as cake, cookies, candy, chips, and soft drinks. How do I count carbohydrates? There are two ways to count carbohydrates in food. You can use either of the methods or a combination of both. Reading "Nutrition Facts" on packaged food The "Nutrition Facts" list is included on the labels of almost all packaged foods and beverages in the [place] It includes:  The serving size.  Information about nutrients in each serving, including the grams (g) of carbohydrate per serving. To use the "Nutrition Facts":  Decide how many servings you will have.  Multiply the number of servings by the number of carbohydrates per serving.  The resulting number is the total amount of carbohydrates that you will be having. Learning standard serving sizes of other foods When you eat carbohydrate foods that are not packaged or do not include "Nutrition Facts" on the label, you need to measure the servings in order to count the amount of carbohydrates:  Measure the foods that you  will eat with a food scale or measuring cup, if needed.  Decide how many standard-size servings you will eat.  Multiply the number of servings by 15. Most carbohydrate-rich foods have about 15 g of carbohydrates per serving. ? For example, if you eat 8 oz (170 g) of strawberries, you will have eaten 2 servings and 30 g of carbohydrates (2 servings x 15 g = 30 g).  For foods that have more than one food mixed, such as soups and casseroles, you must count the carbohydrates in each food that is included. The following list contains standard serving sizes of common carbohydrate-rich foods. Each of these servings has about 15 g of carbohydrates:   hamburger bun or  English muffin.   oz (15 mL) syrup.   oz (14 g) jelly.  1 slice of bread.  1 six-inch tortilla.  3 oz (85 g) cooked rice or pasta.  4 oz (113 g) cooked dried beans.  4 oz (113 g) starchy vegetable, such as peas, corn, or potatoes.  4 oz (113 g) hot cereal.  4 oz (113 g) mashed potatoes or  of a large baked potato.  4 oz (113 g) canned or frozen fruit.  4 oz (120 mL) fruit juice.  4-6 crackers.  6 chicken nuggets.  6 oz (170 g) unsweetened dry cereal.  6 oz (170 g) plain fat-free yogurt or yogurt sweetened with artificial sweeteners.  8 oz (240 mL) milk.  8 oz (170 g) fresh fruit or one small piece of fruit.  24 oz (680 g) popped popcorn. Example of carbohydrate counting Sample meal  3 oz (85 g) chicken breast.  6 oz (170 g) brown rice.  4 oz (113 g) corn.  8 oz (240 mL) milk.  8 oz (170 g) strawberries with sugar-free whipped topping. Carbohydrate calculation 1. Identify the foods that contain carbohydrates: ? Rice. ? Corn. ? Milk. ? Strawberries. 2. Calculate how many servings you have of each food: ? 2 servings rice. ? 1 serving corn. ? 1 serving milk. ? 1 serving strawberries. 3. Multiply each number of servings by 15 g: ? 2 servings rice x 15 g = 30 g. ? 1 serving corn x 15 g = 15  g. ? 1 serving milk x 15 g = 15 g. ? 1 serving strawberries x 15 g = 15 g. 4. Add together all of the amounts to find the total grams of carbohydrates eaten: ? 30 g + 15 g + 15 g + 15 g = 75 g of carbohydrates total. Summary  Carbohydrate counting is a method of keeping track of how many carbohydrates you eat.  Eating carbohydrates naturally increases the amount of sugar (glucose) in the blood.  Counting how many carbohydrates you eat helps keep your blood glucose within normal limits, which helps you manage your diabetes.  A diet and nutrition specialist (registered dietitian) can help you make a meal plan and calculate how many carbohydrates you should have at each meal and snack. This information is not intended to replace advice given to you by your health care provider. Make sure you discuss any questions you have with your health care provider. Document Released: 11/08/2005 Document Revised: 05/18/2017 Document Reviewed: 04/21/2016 Elsevier Interactive Patient Education  2019 [place] Maintenance, Female Adopting a healthy lifestyle and getting preventive care can go a long way to promote health and wellness. Talk with your health care provider about what schedule of regular examinations is right for you. This is a good chance for you to check in with your provider about disease prevention and staying healthy. In between checkups, there are plenty of things you can do on your own. Experts have done a lot of research about which lifestyle changes and preventive measures are most likely to keep you healthy. Ask your health care provider for more information. Weight and diet Eat a healthy diet  Be sure to include plenty of vegetables, fruits, low-fat dairy products, and lean protein.  Do not eat a lot of foods high in solid fats, added sugars, or salt.  Get regular exercise. This is one of the most important things you can do for your health. ? Most adults should exercise  for at least 150 minutes each week. The exercise should increase your heart rate and make you sweat (moderate-intensity exercise). ? Most adults should also do strengthening exercises at least twice a week. This is in addition to the moderate-intensity exercise. Maintain a healthy weight  Body mass index (BMI) is a measurement that can be used to identify possible weight problems. It estimates body fat based on height and weight. Your health care provider can help determine your BMI and help you achieve or maintain a healthy weight.  For females 70 years of age and older: ? A BMI below 18.5 is considered underweight. ? A BMI of 18.5 to 24.9 is normal. ? A BMI of 25 to 29.9 is considered overweight. ? A BMI of 30 and above is considered obese. Watch levels of cholesterol and blood lipids  You should start having your blood tested for lipids and cholesterol  at 34 years of age, then have this test every 5 years.  You may need to have your cholesterol levels checked more often if: ? Your lipid or cholesterol levels are high. ? You are older than 34 years of age. ? You are at high risk for heart disease. Cancer screening Lung Cancer  Lung cancer screening is recommended for adults 77-17 years old who are at high risk for lung cancer because of a history of smoking.  A yearly low-dose CT scan of the lungs is recommended for people who: ? Currently smoke. ? Have quit within the past 15 years. ? Have at least a 30-pack-year history of smoking. A pack year is smoking an average of one pack of cigarettes a day for 1 year.  Yearly screening should continue until it has been 15 years since you quit.  Yearly screening should stop if you develop a health problem that would prevent you from having lung cancer treatment. Breast Cancer  Practice breast self-awareness. This means understanding how your breasts normally appear and feel.  It also means doing regular breast self-exams. Let your health  care provider know about any changes, no matter how small.  If you are in your 20s or 30s, you should have a clinical breast exam (CBE) by a health care provider every 1-3 years as part of a regular health exam.  If you are 27 or older, have a CBE every year. Also consider having a breast X-ray (mammogram) every year.  If you have a family history of breast cancer, talk to your health care provider about genetic screening.  If you are at high risk for breast cancer, talk to your health care provider about having an MRI and a mammogram every year.  Breast cancer gene (BRCA) assessment is recommended for women who have family members with BRCA-related cancers. BRCA-related cancers include: ? Breast. ? Ovarian. ? Tubal. ? Peritoneal cancers.  Results of the assessment will determine the need for genetic counseling and BRCA1 and BRCA2 testing. Cervical Cancer Your health care provider may recommend that you be screened regularly for cancer of the pelvic organs (ovaries, uterus, and vagina). This screening involves a pelvic examination, including checking for microscopic changes to the surface of your cervix (Pap test). You may be encouraged to have this screening done every 3 years, beginning at age 38.  For women ages 71-65, health care providers may recommend pelvic exams and Pap testing every 3 years, or they may recommend the Pap and pelvic exam, combined with testing for human papilloma virus (HPV), every 5 years. Some types of HPV increase your risk of cervical cancer. Testing for HPV may also be done on women of any age with unclear Pap test results.  Other health care providers may not recommend any screening for nonpregnant women who are considered low risk for pelvic cancer and who do not have symptoms. Ask your health care provider if a screening pelvic exam is right for you.  If you have had past treatment for cervical cancer or a condition that could lead to cancer, you need Pap  tests and screening for cancer for at least 20 years after your treatment. If Pap tests have been discontinued, your risk factors (such as having a new sexual partner) need to be reassessed to determine if screening should resume. Some women have medical problems that increase the chance of getting cervical cancer. In these cases, your health care provider may recommend more frequent screening and Pap tests. Colorectal  Cancer  This type of cancer can be detected and often prevented.  Routine colorectal cancer screening usually begins at 34 years of age and continues through 34 years of age.  Your health care provider may recommend screening at an earlier age if you have risk factors for colon cancer.  Your health care provider may also recommend using home test kits to check for hidden blood in the stool.  A small camera at the end of a tube can be used to examine your colon directly (sigmoidoscopy or colonoscopy). This is done to check for the earliest forms of colorectal cancer.  Routine screening usually begins at age 99.  Direct examination of the colon should be repeated every 5-10 years through 34 years of age. However, you may need to be screened more often if early forms of precancerous polyps or small growths are found. Skin Cancer  Check your skin from head to toe regularly.  Tell your health care provider about any new moles or changes in moles, especially if there is a change in a mole's shape or color.  Also tell your health care provider if you have a mole that is larger than the size of a pencil eraser.  Always use sunscreen. Apply sunscreen liberally and repeatedly throughout the day.  Protect yourself by wearing long sleeves, pants, a wide-brimmed hat, and sunglasses whenever you are outside. Heart disease, diabetes, and high blood pressure  High blood pressure causes heart disease and increases the risk of stroke. High blood pressure is more likely to develop  in: ? People who have blood pressure in the high end of the normal range (130-139/85-89 mm Hg). ? People who are overweight or obese. ? People who are African American.  If you are 44-44 years of age, have your blood pressure checked every 3-5 years. If you are 2 years of age or older, have your blood pressure checked every year. You should have your blood pressure measured twice-once when you are at a hospital or clinic, and once when you are not at a hospital or clinic. Record the average of the two measurements. To check your blood pressure when you are not at a hospital or clinic, you can use: ? An automated blood pressure machine at a pharmacy. ? A home blood pressure monitor.  If you are between 33 years and 49 years old, ask your health care provider if you should take aspirin to prevent strokes.  Have regular diabetes screenings. This involves taking a blood sample to check your fasting blood sugar level. ? If you are at a normal weight and have a low risk for diabetes, have this test once every three years after 34 years of age. ? If you are overweight and have a high risk for diabetes, consider being tested at a younger age or more often. Preventing infection Hepatitis B  If you have a higher risk for hepatitis B, you should be screened for this virus. You are considered at high risk for hepatitis B if: ? You were born in a country where hepatitis B is common. Ask your health care provider which countries are considered high risk. ? Your parents were born in a high-risk country, and you have not been immunized against hepatitis B (hepatitis B vaccine). ? You have HIV or AIDS. ? You use needles to inject street drugs. ? You live with someone who has hepatitis B. ? You have had sex with someone who has hepatitis B. ? You get hemodialysis treatment. ?  You take certain medicines for conditions, including cancer, organ transplantation, and autoimmune conditions. Hepatitis C  Blood  testing is recommended for: ? Everyone born from [birth year] through 1966. ? Anyone with known risk factors for hepatitis C. Sexually transmitted infections (STIs)  You should be screened for sexually transmitted infections (STIs) including gonorrhea and chlamydia if: ? You are sexually active and are younger than 34 years of age. ? You are older than 34 years of age and your health care provider tells you that you are at risk for this type of infection. ? Your sexual activity has changed since you were last screened and you are at an increased risk for chlamydia or gonorrhea. Ask your health care provider if you are at risk.  If you do not have HIV, but are at risk, it may be recommended that you take a prescription medicine daily to prevent HIV infection. This is called pre-exposure prophylaxis (PrEP). You are considered at risk if: ? You are sexually active and do not regularly use condoms or know the HIV status of your partner(s). ? You take drugs by injection. ? You are sexually active with a partner who has HIV. Talk with your health care provider about whether you are at high risk of being infected with HIV. If you choose to begin PrEP, you should first be tested for HIV. You should then be tested every 3 months for as long as you are taking PrEP. Pregnancy  If you are premenopausal and you may become pregnant, ask your health care provider about preconception counseling.  If you may become pregnant, take 400 to 800 micrograms (mcg) of folic acid every day.  If you want to prevent pregnancy, talk to your health care provider about birth control (contraception). Osteoporosis and menopause  Osteoporosis is a disease in which the bones lose minerals and strength with aging. This can result in serious bone fractures. Your risk for osteoporosis can be identified using a bone density scan.  If you are 8 years of age or older, or if you are at risk for osteoporosis and fractures, ask your health  care provider if you should be screened.  Ask your health care provider whether you should take a calcium or vitamin D supplement to lower your risk for osteoporosis.  Menopause may have certain physical symptoms and risks.  Hormone replacement therapy may reduce some of these symptoms and risks. Talk to your health care provider about whether hormone replacement therapy is right for you. Follow these instructions at home:  Schedule regular health, dental, and eye exams.  Stay current with your immunizations.  Do not use any tobacco products including cigarettes, chewing tobacco, or electronic cigarettes.  If you are pregnant, do not drink alcohol.  If you are breastfeeding, limit how much and how often you drink alcohol.  Limit alcohol intake to no more than 1 drink per day for nonpregnant women. One drink equals 12 ounces of beer, 5 ounces of wine, or 1 ounces of hard liquor.  Do not use street drugs.  Do not share needles.  Ask your health care provider for help if you need support or information about quitting drugs.  Tell your health care provider if you often feel depressed.  Tell your health care provider if you have ever been abused or do not feel safe at home. This information is not intended to replace advice given to you by your health care provider. Make sure you discuss any questions you have with your  health care provider. Document Released: 05/24/2011 Document Revised: 04/15/2016 Document Reviewed: 08/12/2015 Elsevier Interactive Patient Education  2019 [employer].

## 2019-01-08 NOTE — Progress Notes (Signed)
Melissa Greene [birth year] [id number]    History:    Presents for annual exam.  Monthly cycle/condoms.  Gardasil series completed.  2006 and 1 normal Paps after.  11/27/2018+ chlamydia, treated, abstain, ex partner informed, will check test of cure today.  History of recurrent BV .  Has gained 34 pounds in the past 2 years.  Desiring weight loss surgery and has follow-up scheduled.  Normal TSH, CMP, H/H, hemoglobin A1c.  Reports normal mammogram last year at a health screening.  Past medical history, past surgical history, family history and social history were all reviewed and documented in the EPIC chart.  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  Originally from [place].  Recently bought a new home.  Father diabetes, hypertension deceased.  ROS:  A ROS was performed and pertinent positives and negatives are included.  Exam:  Vitals:   01/08/19 1608  BP: 132/80  Weight: (!) 304 lb (137.9 kg)  Height: 5\' 4"  (1.626 m)   Body mass index is 52.18 kg/m.   General appearance:  Normal Thyroid:  Symmetrical, normal in size, without palpable masses or nodularity. Respiratory  Auscultation:  Clear without wheezing or rhonchi Cardiovascular  Auscultation:  Regular rate, without rubs, murmurs or gallops  Edema/varicosities:  Not grossly evident Abdominal  Soft,nontender, without masses, guarding or rebound.  Liver/spleen:  No organomegaly noted  Hernia:  None appreciated  Skin  Inspection:  Grossly normal   Breasts: Examined lying and sitting.     Right: Without masses, retractions, discharge or axillary adenopathy.     Left: Without masses, retractions, discharge or axillary adenopathy. Gentitourinary   Inguinal/mons:  Normal without inguinal adenopathy  External genitalia:  Normal  BUS/Urethra/Skene's glands:  Normal  Vagina:  Normal  Cervix:  Normal  Uterus:   normal in size, shape and contour.  Midline and mobile  Adnexa/parametria:     Rt: Without masses or  tenderness.   Lt: Without masses or tenderness.  Anus and perineum: Normal  Digital rectal exam: Normal sphincter tone without palpated masses or tenderness  Assessment/Plan:  33 y.o. SBF G0 for annual exam.     Monthly cycle/condoms Test of cure chlamydia Morbid obesity  Plan: GC/Chlamydia culture pending. Reviewed wet prep/exam negative, history of recurrent BV, discussed boric acid gelcaps twice weekly per vagina for prevention.  SBEs, exercise, calcium rich foods, MVI daily encouraged.  Contraception reviewed, declines will use condoms.  Reviewed importance of low-carb/calorie diet.  HIV, hep B, C, RPR.  Pap with HR HPV typing, new screening guidelines reviewed.   Harrington Challenger [facility], 4:18 PM 01/08/2019

## 2019-01-09 LAB — C. TRACHOMATIS/N. GONORRHOEAE RNA
C. TRACHOMATIS RNA, TMA: NOT DETECTED
N. gonorrhoeae RNA, TMA: NOT DETECTED

## 2019-01-09 NOTE — Addendum Note (Signed)
Addended by: Tito Dine on: 01/09/2019 08:22 AM   Modules accepted: Orders

## 2019-01-10 LAB — PAP IG W/ RFLX HPV ASCU

## 2019-01-26 IMAGING — RF DG UGI W/ HIGH DENSITY W/KUB
10 of 13 series · 14 of 24 positions shown · non-contrast
Comparison: None.

CLINICAL DATA: Preoperative assessment for bariatric surgery.

EXAM:
UPPER GI SERIES WITH KUB
TECHNIQUE: After obtaining a scout radiograph a routine upper GI series was
performed using thin and high density barium.
FLUOROSCOPY TIME:  Fluoroscopy Time:  3 minutes and 24 seconds.
Radiation Exposure Index (if provided by the fluoroscopic device):
358 mGy
Number of Acquired Spot Images:

[Series 1: one shot · 0.14mm/px · 1 of 1 slices shown]
[im 1/1]
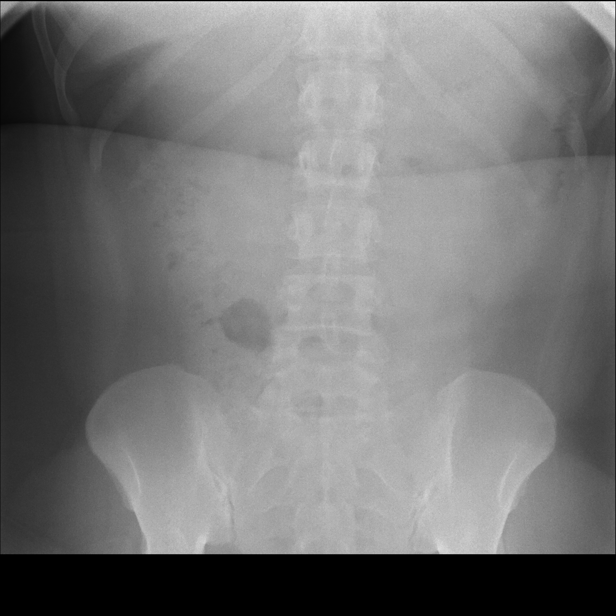

[Series 2: sequence · 0.29mm/px · 2 acquisitions, 1 frame shown (1 of 9)]
[im 2/2]
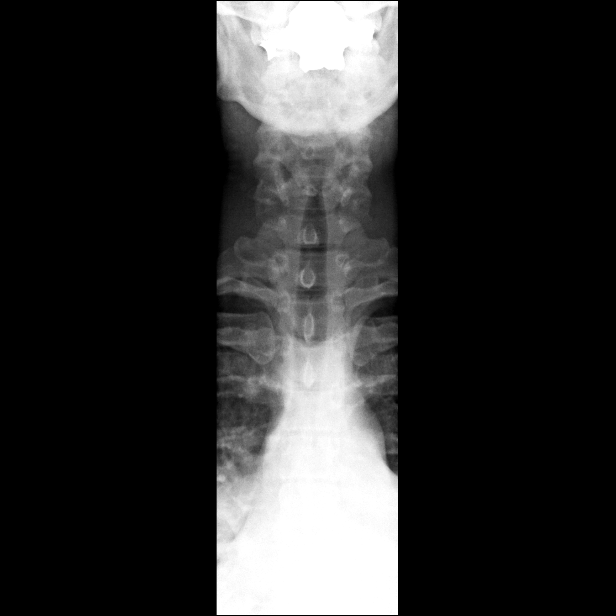

[Series 3: sequence · 0.29mm/px · 2 acquisitions, 2 frames shown (2 of 9)]
[im 1/2]
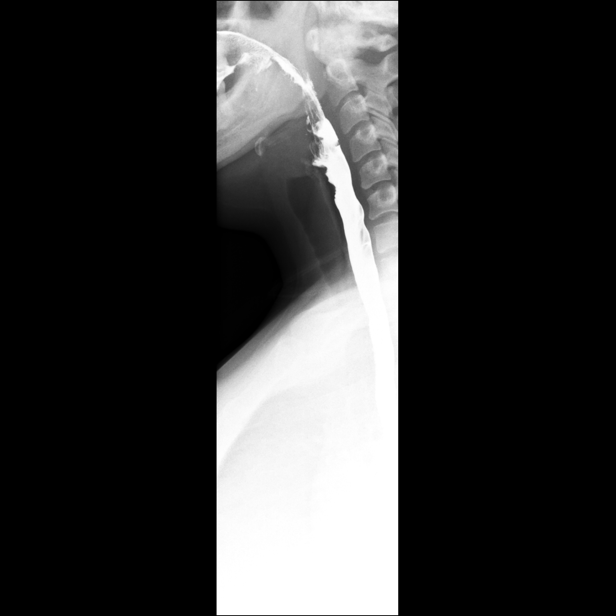
[im 2/2]
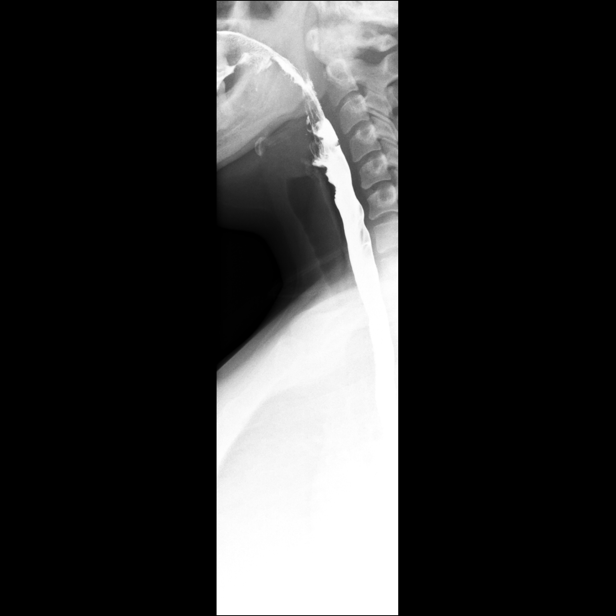

[Series 4: sequence · 0.29mm/px · 2 acquisitions, 2 frames shown (3 of 9)]
[im 1/2]
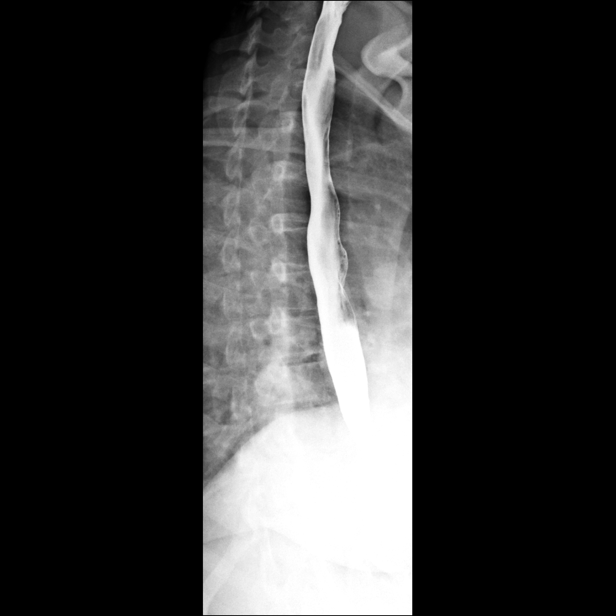
[im 2/2]
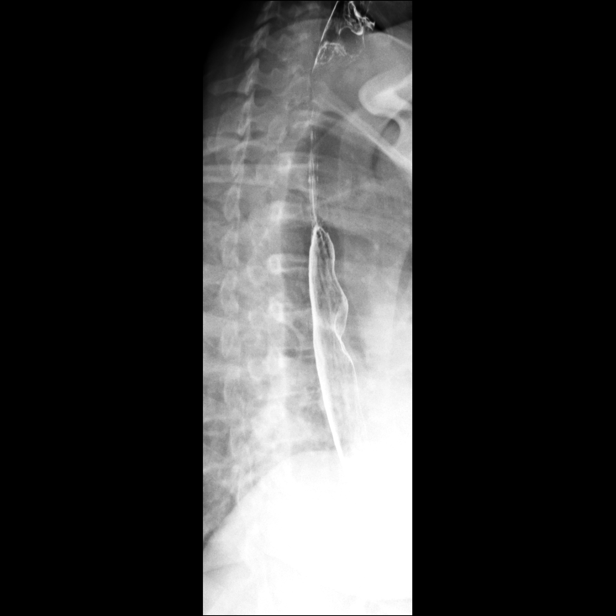

[Series 5: sequence · 2 acquisitions, 2 frames shown (4 of 9)]
[im 1/2]
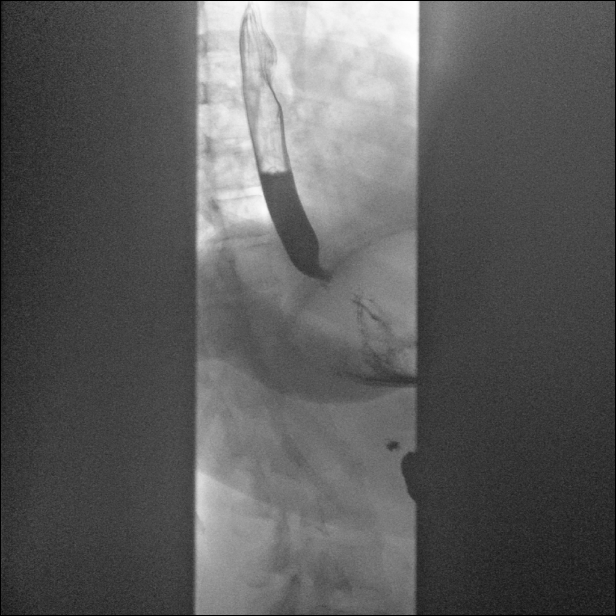
[im 2/2]
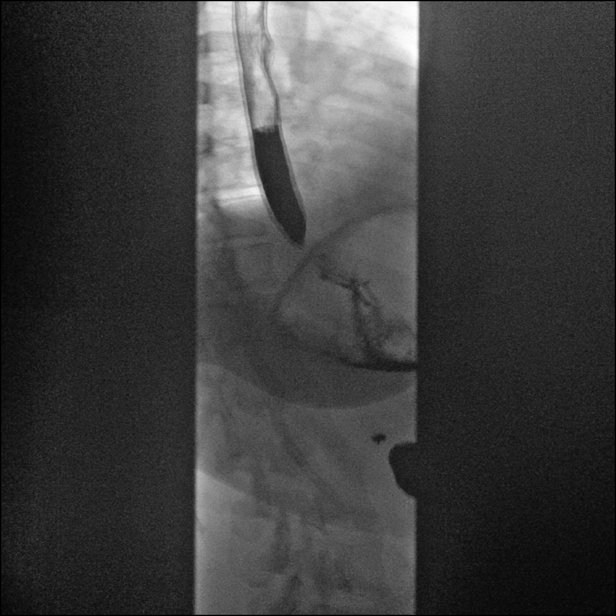

[Series 6: sequence · 0.29mm/px · 1 of 2 slices shown (5 of 9)]
[im 1/2]
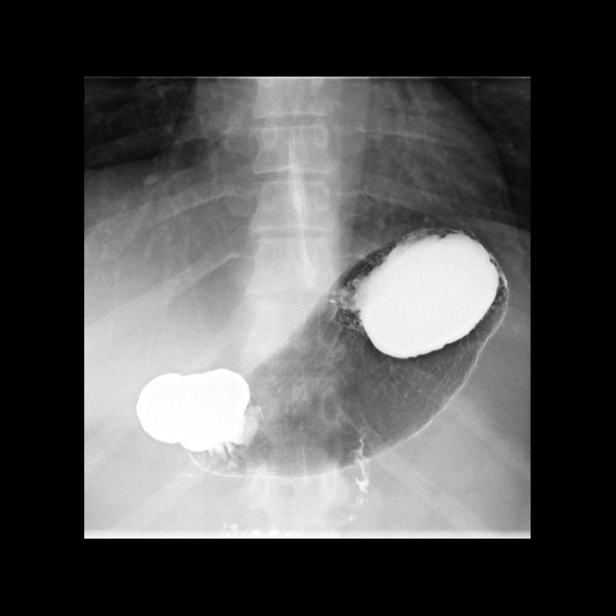

[Series 10: sequence · 0.29mm/px · 1 of 2 slices shown (6 of 9)]
[im 1/2]
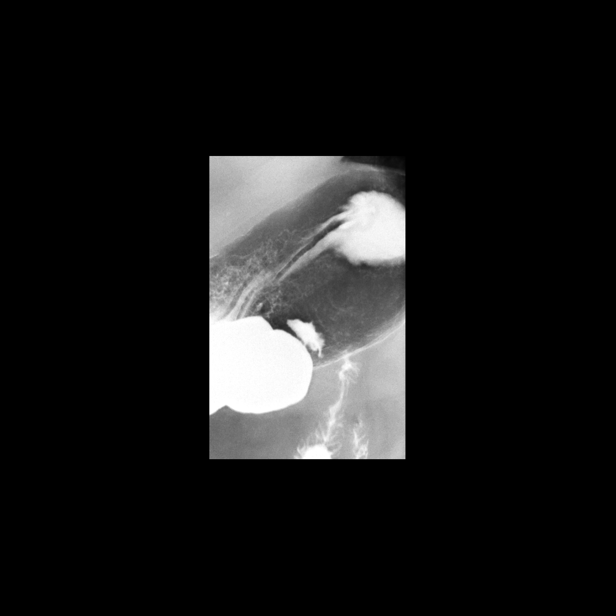

[Series 14: sequence · 2 acquisitions, 2 frames shown (7 of 9)]
[im 1/2]
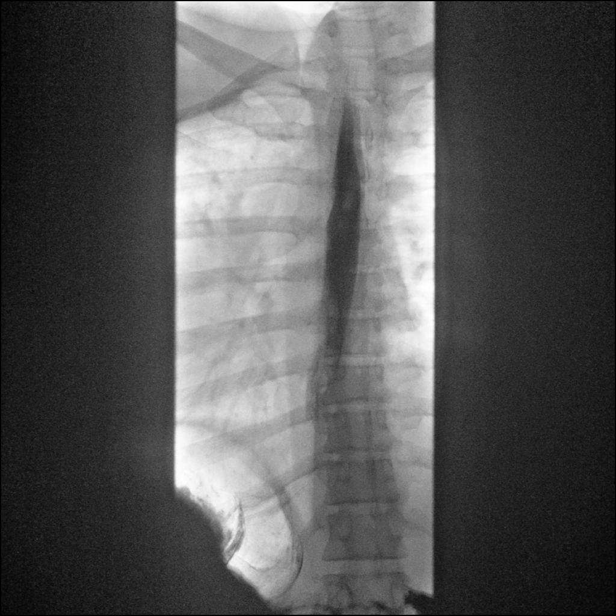
[im 1/2]
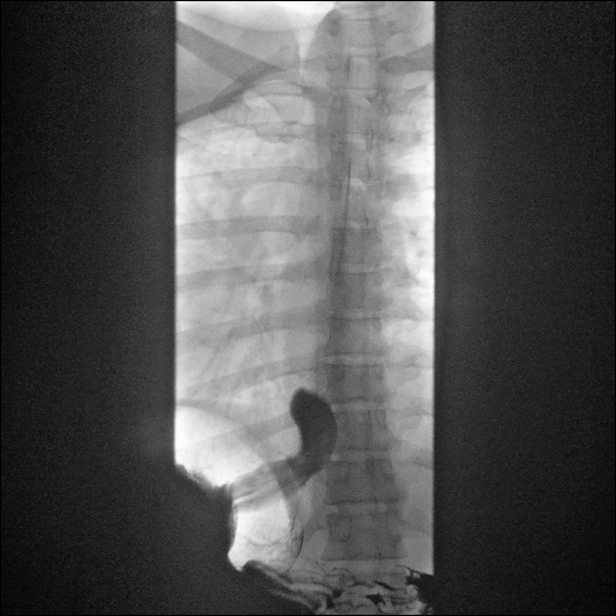

[Series 16: sequence · 0.29mm/px · 1 of 2 slices shown (8 of 9)]
[im 1/2]
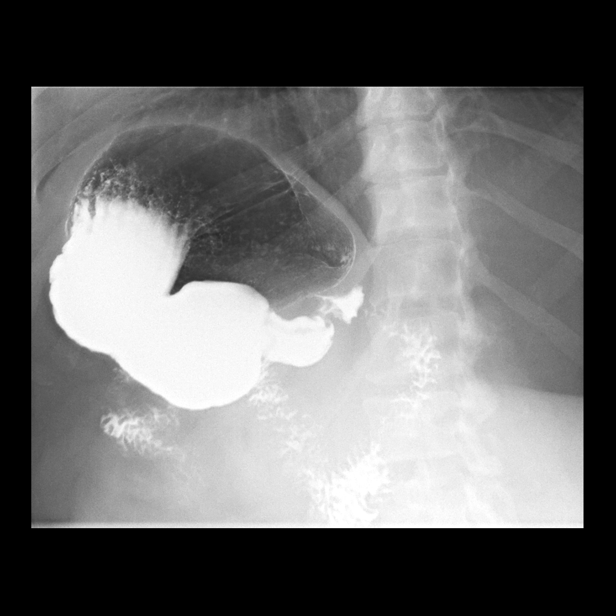

[Series 18: sequence · 2 acquisitions, 1 frame shown (9 of 9)]
[im 1/2]
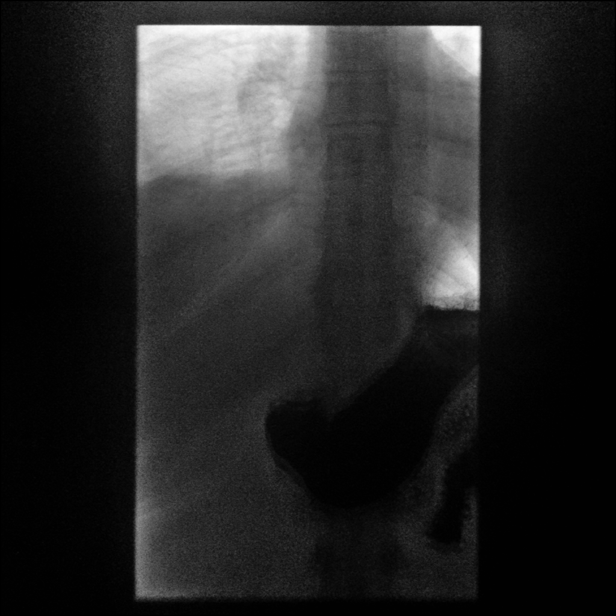

[14 of 24 positions shown; findings below may reference images not displayed]

FINDINGS: Pre-procedure KUB shows a normal bowel gas pattern. Frontal and
lateral views of the hypopharynx while swallowing are normal.

Double contrast imaging of the esophagus is normal. Esophageal
motility is normal. 13 mm barium tablet passes readily into the
stomach when taken with water.

Double contrast image of the stomach shows normal position and
orientation. No mucosal fold thickening. No evidence for mucosal
ulceration or gastric mass lesion. Gastric emptying is prompt in the
pylorus is normal in appearance. Duodenal bulb is normal without
evidence for mucosal ulceration. Duodenal C-loop and ligament of
Treitz are normally positioned.

Multiple episodes of gastroesophageal reflux were observed during
the study.
IMPRESSION: 1. Gastroesophageal reflux. Otherwise normal double contrast upper
GI series.

## 2019-02-05 ENCOUNTER — Telehealth: Payer: Self-pay | Admitting: Skilled Nursing Facility1

## 2019-02-05 NOTE — Telephone Encounter (Signed)
Dietitian called pt to assess their understanding of the pre-op nutrition recommendations through the teach back method to ensure the pts knowledge readiness in preparation for surgery.   Pt states she ahs to look back through her pre-op calss materials. Pt asked what she is supposed to be eat ing for her pre-op diet. Dietitian education the pt on the pre-op diet and appropriate foods as well as to look at her pre-op materials for more food ideas.

## 2019-02-06 NOTE — Patient Instructions (Addendum)
Edd ArbourMelanie M Weathersby  02/06/2019   Your procedure is scheduled on: 02-13-19    [facility]    Report to Admitting at 6:45 AM    Call this number if you have problems the morning of surgery [phone]    Remember: Do not eat food or drink liquids :After Midnight. MORNING OF SURGERY DRINK:  1 GATORADE  BEFORE YOU LEAVE HOME, DRINK ALL OF THE SHAKE AT ONE TIME.  NO SOLID FOOD AFTER 600 PM THE NIGHT BEFORE YOUR SURGERY. YOU MAY DRINK CLEAR FLUIDS. THE SHAKE YOU DRINK BEFORE YOU LEAVE HOME WILL BE THE LAST FLUIDS YOU DRINK BEFORE SURGERY.  PAIN IS EXPECTED AFTER SURGERY AND WILL NOT BE COMPLETELY ELIMINATED. AMBULATION AND TYLENOL WILL HELP REDUCE INCISIONAL AND GAS PAIN. MOVEMENT IS KEY!  YOU ARE EXPECTED TO BE OUT OF BED WITHIN 4 HOURS OF ADMISSION TO YOUR PATIENT ROOM.  SITTING IN THE RECLINER THROUGHOUT THE DAY IS IMPORTANT FOR DRINKING FLUIDS AND MOVING GAS THROUGHOUT THE GI TRACT.  COMPRESSION STOCKINGS SH[facility] UNLESS YOU ARE WALKING.   INCENTIVE SPIROMETER SHOULD BE USED EVERY HOUR WHILE AWAKE TO DECREASE POST-OPERATIVE COMPLICATIONS SUCH AS PNEUMONIA.  WHEN DISCHARGED HOME, IT IS IMPORTANT TO CONTINUE TO WALK EVERY HOUR AND USE THE INCENTIVE SPIROMETER EVERY HOUR.           CLEAR LIQUID DIET   Foods Allowed                                                                     Foods Excluded  Coffee and tea, regular and decaf                             liquids that you cannot  Plain Jell-O in any flavor                                             see through such as: Fruit ices (not with fruit pulp)                                     milk, soups, orange juice  Iced Popsicles                                    All solid food Carbonated beverages, regular and diet                                    Cranberry, grape and apple juices Sports drinks like Gatorade Lightly seasoned clear broth or  consume(fat free) Sugar, honey syrup  Sample Menu Breakfast  Lunch                                     Supper Cranberry juice                    Beef broth                            Chicken broth Jell-O                                     Grape juice                           Apple juice Coffee or tea                        Jell-O                                      Popsicle                                                Coffee or tea                        Coffee or tea  _____________________________________________________________________     BRUSH YOUR TEETH MORNING OF SURGERY AND RINSE YOUR MOUTH OUT, NO CHEWING GUM CANDY OR MINTS.     Take these medicines the morning of surgery with A SIP OF WATER: None                                You may not have any metal on your body including hair pins and              piercings  Do not wear jewelry, make-up, lotions, powders or perfumes, deodorant             Do not wear nail polish.  Do not shave  48 hours prior to surgery.                Do not bring valuables to the hospital. [facility] IS NOT             RESPONSIBLE   FOR VALUABLES.  Contacts, dentures or bridgework may not be worn into surgery.  Leave suitcase in the car. After surgery it may be brought to your room.     Patients discharged the day of surgery will not be allowed to drive home. IF YOU ARE HAVING SURGERY AND GOING HOME THE SAME DAY, YOU MUST HAVE AN ADULT TO DRIVE YOU HOME AND BE WITH YOU FOR 24 HOURS. YOU MAY GO HOME BY TAXI OR UBER OR ORTHERWISE, BUT AN ADULT MUST ACCOMPANY YOU HOME AND STAY WITH YOU FOR 24 HOURS.    Special Instructions: N/A              Please read over the following fact sheets you were given:  _____________________________________________________________________             [facility] - Preparing for Surgery Before surgery, you can play an important role.  Because skin is not sterile, your skin needs to  be as free of germs as possible.  You can reduce the number of germs on your skin by washing with CHG (chlorahexidine gluconate) soap before surgery.  CHG is an antiseptic cleaner which kills germs and bonds with the skin to continue killing germs even after washing. Please DO NOT use if you have an allergy to CHG or antibacterial soaps.  If your skin becomes reddened/irritated stop using the CHG and inform your nurse when you arrive at Short Stay. Do not shave (including legs and underarms) for at least 48 hours prior to the first CHG shower.  You may shave your face/neck. Please follow these instructions carefully:  1.  Shower with CHG Soap the night before surgery and the  morning of Surgery.  2.  If you choose to wash your hair, wash your hair first as usual with your  normal  shampoo.  3.  After you shampoo, rinse your hair and body thoroughly to remove the  shampoo.                           4.  Use CHG as you would any other liquid soap.  You can apply chg directly  to the skin and wash                       Gently with a scrungie or clean washcloth.  5.  Apply the CHG Soap to your body ONLY FROM THE NECK DOWN.   Do not use on face/ open                           Wound or open sores. Avoid contact with eyes, ears mouth and genitals (private parts).                       Wash face,  Genitals (private parts) with your normal soap.             6.  Wash thoroughly, paying special attention to the area where your surgery  will be performed.  7.  Thoroughly rinse your body with warm water from the neck down.  8.  DO NOT shower/wash with your normal soap after using and rinsing off  the CHG Soap.                9.  Pat yourself dry with a clean towel.            10.  Wear clean pajamas.            11.  Place clean sheets on your bed the night of your first shower and do not  sleep with pets. Day of Surgery : Do not apply any lotions/deodorants the morning of surgery.  Please wear clean clothes to  the hospital/surgery center.  FAILURE TO FOLLOW THESE INSTRUCTIONS MAY RESULT IN THE CANCELLATION OF YOUR SURGERY PATIENT SIGNATURE_________________________________  NURSE SIGNATURE__________________________________  ________________________________________________________________________

## 2019-02-06 NOTE — Progress Notes (Signed)
Please place surgery orders. Pt is scheduled for PAT appointment tomorrow 02-07-19  at 1:00 PM. Thank you

## 2019-02-07 ENCOUNTER — Encounter (HOSPITAL_COMMUNITY): Payer: Self-pay

## 2019-02-07 ENCOUNTER — Other Ambulatory Visit: Payer: Self-pay

## 2019-02-07 ENCOUNTER — Encounter (HOSPITAL_COMMUNITY)
Admission: RE | Admit: 2019-02-07 | Discharge: 2019-02-07 | Disposition: A | Discharge status: Home / Self Care | Payer: BC Managed Care – PPO | Source: Ambulatory Visit | Attending: General Surgery | Admitting: General Surgery

## 2019-02-07 DIAGNOSIS — Z01812 Encounter for preprocedural laboratory examination: Secondary | ICD-10-CM | POA: Diagnosis present

## 2019-02-07 LAB — CBC
HEMATOCRIT: 46.7 % — AB (ref 36.0–46.0)
Hemoglobin: 13.7 g/dL (ref 12.0–15.0)
MCH: 23.6 pg — ABNORMAL LOW (ref 26.0–34.0)
MCHC: 29.3 g/dL — ABNORMAL LOW (ref 30.0–36.0)
MCV: 80.4 fL (ref 80.0–100.0)
Platelets: 352 10*3/uL (ref 150–400)
RBC: 5.81 MIL/uL — ABNORMAL HIGH (ref 3.87–5.11)
RDW: 14.6 % (ref 11.5–15.5)
WBC: 8.5 10*3/uL (ref 4.0–10.5)
nRBC: 0 % (ref 0.0–0.2)

## 2019-02-07 LAB — PREGNANCY, URINE: Preg Test, Ur: NEGATIVE

## 2019-02-27 ENCOUNTER — Ambulatory Visit: Payer: BC Managed Care – PPO

## 2019-03-19 ENCOUNTER — Encounter (HOSPITAL_COMMUNITY): Admission: RE | Payer: Self-pay | Source: Other Acute Inpatient Hospital

## 2019-03-19 ENCOUNTER — Inpatient Hospital Stay (HOSPITAL_COMMUNITY)
Admission: RE | Admit: 2019-03-19 | Payer: BC Managed Care – PPO | Source: Other Acute Inpatient Hospital | Admitting: General Surgery

## 2019-03-19 SURGERY — GASTRECTOMY, SLEEVE, LAPAROSCOPIC
Anesthesia: General

## 2019-04-13 ENCOUNTER — Encounter (HOSPITAL_COMMUNITY): Payer: BC Managed Care – PPO

## 2019-04-23 ENCOUNTER — Encounter (HOSPITAL_COMMUNITY): Admission: RE | Payer: Self-pay | Source: Other Acute Inpatient Hospital

## 2019-04-23 ENCOUNTER — Inpatient Hospital Stay (HOSPITAL_COMMUNITY)
Admission: RE | Admit: 2019-04-23 | Payer: BC Managed Care – PPO | Source: Other Acute Inpatient Hospital | Admitting: General Surgery

## 2019-04-23 SURGERY — LAPAROSCOPIC ROUX-EN-Y GASTRIC BYPASS WITH UPPER ENDOSCOPY
Anesthesia: General

## 2019-06-11 ENCOUNTER — Encounter: Payer: Self-pay | Admitting: Women's Health

## 2019-06-11 ENCOUNTER — Ambulatory Visit: Payer: BC Managed Care – PPO | Admitting: Women's Health

## 2019-06-11 ENCOUNTER — Other Ambulatory Visit: Payer: Self-pay

## 2019-06-11 VITALS — BP 124/80

## 2019-06-11 DIAGNOSIS — Z113 Encounter for screening for infections with a predominantly sexual mode of transmission: Secondary | ICD-10-CM

## 2019-06-11 DIAGNOSIS — B9689 Other specified bacterial agents as the cause of diseases classified elsewhere: Secondary | ICD-10-CM

## 2019-06-11 DIAGNOSIS — N760 Acute vaginitis: Secondary | ICD-10-CM | POA: Diagnosis not present

## 2019-06-11 MED ORDER — METRONIDAZOLE 500 MG PO TABS: 500.0000 mg | ORAL_TABLET | Freq: Two times a day (BID) | ORAL | 0 refills | Status: DC

## 2019-06-11 MED ORDER — FLUCONAZOLE 150 MG PO TABS: ORAL_TABLET | ORAL | 0 refills | Status: DC

## 2019-06-11 NOTE — Progress Notes (Signed)
33 year old SBF G0 presents with complaint of vaginal burning with discharge and requesting STD screen.  Had unprotected intercourse and took Plan B emergency contraception within 2 hours.  Monthly cycle/condoms.  Denies urinary symptoms, abdominal pain, back pain or fever.  Has had recurrent BV.   Had not been sexually active for greater than 1 year prior.  Had been scheduled twice for gastric sleeve, surgery was canceled twice due to [place].  Music therapist.  Exam: Appears well, obese, external genitalia within normal limits, speculum exam moderate amount of a white adherent discharge with odor noted, wet prep positive for yeast, clues, TNTC bacteria.  GC/chlamydia culture taken.  Bimanual no CMT.  Bacterial vaginosis yeast vaginitis STD screen  Plan: Flagyl 500 twice daily for 7 days, alcohol precautions reviewed.  Diflucan 150 p.o. today repeat in 3 days.  Boric acid gelcap suppression therapy discussed will start after completing medications, 1 gelcap twice weekly per vagina.  Will call if continued problems.  Contraception reviewed declines, condoms.

## 2019-06-11 NOTE — Patient Instructions (Addendum)
Boric acid gel caps place in vagina twice weekly, 30 for $10 on amazon  Vaginal Yeast infection, Adult  Vaginal yeast infection is a condition that causes vaginal discharge as well as soreness, swelling, and redness (inflammation) of the vagina. This is a common condition. Some women get this infection frequently. What are the causes? This condition is caused by a change in the normal balance of the yeast (candida) and bacteria that live in the vagina. This change causes an overgrowth of yeast, which causes the inflammation. What increases the risk? The condition is more likely to develop in women who:  Take antibiotic medicines.  Have diabetes.  Take birth control pills.  Are pregnant.  Douche often.  Have a weak body defense system (immune system).  Have been taking steroid medicines for a long time.  Frequently wear tight clothing. What are the signs or symptoms? Symptoms of this condition include:  White, thick, creamy vaginal discharge.  Swelling, itching, redness, and irritation of the vagina. The lips of the vagina (vulva) may be affected as well.  Pain or a burning feeling while urinating.  Pain during sex. How is this diagnosed? This condition is diagnosed based on:  Your medical history.  A physical exam.  A pelvic exam. Your health care provider will examine a sample of your vaginal discharge under a microscope. Your health care provider may send this sample for testing to confirm the diagnosis. How is this treated? This condition is treated with medicine. Medicines may be over-the-counter or prescription. You may be told to use one or more of the following:  Medicine that is taken by mouth (orally).  Medicine that is applied as a cream (topically).  Medicine that is inserted directly into the vagina (suppository). Follow these instructions at home:  Lifestyle  Do not have sex until your health care provider approves. Tell your sex partner that you  have a yeast infection. That person should go to his or her health care provider and ask if they should also be treated.  Do not wear tight clothes, such as pantyhose or tight pants.  Wear breathable cotton underwear. General instructions  Take or apply over-the-counter and prescription medicines only as told by your health care provider.  Eat more yogurt. This may help to keep your yeast infection from returning.  Do not use tampons until your health care provider approves.  Try taking a sitz bath to help with discomfort. This is a warm water bath that is taken while you are sitting down. The water should only come up to your hips and should cover your buttocks. Do this 3-4 times per day or as told by your health care provider.  Do not douche.  If you have diabetes, keep your blood sugar levels under control.  Keep all follow-up visits as told by your health care provider. This is important. Contact a health care provider if:  You have a fever.  Your symptoms go away and then return.  Your symptoms do not get better with treatment.  Your symptoms get worse.  You have new symptoms.  You develop blisters in or around your vagina.  You have blood coming from your vagina and it is not your menstrual period.  You develop pain in your abdomen. Summary  Vaginal yeast infection is a condition that causes discharge as well as soreness, swelling, and redness (inflammation) of the vagina.  This condition is treated with medicine. Medicines may be over-the-counter or prescription.  Take or apply over-the-counter  and prescription medicines only as told by your health care provider.  Do not douche. Do not have sex or use tampons until your health care provider approves.  Contact a health care provider if your symptoms do not get better with treatment or your symptoms go away and then return. This information is not intended to replace advice given to you by your health care  provider. Make sure you discuss any questions you have with your health care provider. Document Released: 08/18/2005 Document Revised: 03/27/2018 Document Reviewed: 03/27/2018 Elsevier Patient Education  2020 [employer]  Bacterial Vaginosis  Bacterial vaginosis is a vaginal infection that occurs when the normal balance of bacteria in the vagina is disrupted. It results from an overgrowth of certain bacteria. This is the most common vaginal infection among women ages 5915-44. Because bacterial vaginosis increases your risk for STIs (sexually transmitted infections), getting treated can help reduce your risk for chlamydia, gonorrhea, herpes, and HIV (human immunodeficiency virus). Treatment is also important for preventing complications in pregnant women, because this condition can cause an early (premature) delivery. What are the causes? This condition is caused by an increase in harmful bacteria that are normally present in small amounts in the vagina. However, the reason that the condition develops is not fully understood. What increases the risk? The following factors may make you more likely to develop this condition:  Having a new sexual partner or multiple sexual partners.  Having unprotected sex.  Douching.  Having an intrauterine device (IUD).  Smoking.  Drug and alcohol abuse.  Taking certain antibiotic medicines.  Being pregnant. You cannot get bacterial vaginosis from toilet seats, bedding, swimming pools, or contact with objects around you. What are the signs or symptoms? Symptoms of this condition include:  Grey or white vaginal discharge. The discharge can also be watery or foamy.  A fish-like odor with discharge, especially after sexual intercourse or during menstruation.  Itching in and around the vagina.  Burning or pain with urination. Some women with bacterial vaginosis have no signs or symptoms. How is this diagnosed? This condition is diagnosed based  on:  Your medical history.  A physical exam of the vagina.  Testing a sample of vaginal fluid under a microscope to look for a large amount of bad bacteria or abnormal cells. Your health care provider may use a cotton swab or a small wooden spatula to collect the sample. How is this treated? This condition is treated with antibiotics. These may be given as a pill, a vaginal cream, or a medicine that is put into the vagina (suppository). If the condition comes back after treatment, a second round of antibiotics may be needed. Follow these instructions at home: Medicines  Take over-the-counter and prescription medicines only as told by your health care provider.  Take or use your antibiotic as told by your health care provider. Do not stop taking or using the antibiotic even if you start to feel better. General instructions  If you have a female sexual partner, tell her that you have a vaginal infection. She should see her health care provider and be treated if she has symptoms. If you have a female sexual partner, he does not need treatment.  During treatment: ? Avoid sexual activity until you finish treatment. ? Do not douche. ? Avoid alcohol as directed by your health care provider. ? Avoid breastfeeding as directed by your health care provider.  Drink enough water and fluids to keep your urine clear or pale yellow.  Keep the area around your vagina and rectum clean. ? Wash the area daily with warm water. ? Wipe yourself from front to back after using the toilet.  Keep all follow-up visits as told by your health care provider. This is important. How is this prevented?  Do not douche.  Wash the outside of your vagina with warm water only.  Use protection when having sex. This includes latex condoms and dental dams.  Limit how many sexual partners you have. To help prevent bacterial vaginosis, it is best to have sex with just one partner (monogamous).  Make sure you and your  sexual partner are tested for STIs.  Wear cotton or cotton-lined underwear.  Avoid wearing tight pants and pantyhose, especially during summer.  Limit the amount of alcohol that you drink.  Do not use any products that contain nicotine or tobacco, such as cigarettes and e-cigarettes. If you need help quitting, ask your health care provider.  Do not use illegal drugs. Where to find more information  Centers for Disease Control and Prevention: SolutionApps.co.zawww.cdc.gov/std  American Sexual Health Association (ASHA): www.ashastd.org  U.S. Department of Health and Health and safety inspectorHuman Services, Office on Women's Health: ConventionalMedicines.siwww.womenshealth.gov/ or http://www.anderson-williamson.info/https://www.womenshealth.gov/a-z-topics/bacterial-vaginosis Contact a health care provider if:  Your symptoms do not improve, even after treatment.  You have more discharge or pain when urinating.  You have a fever.  You have pain in your abdomen.  You have pain during sex.  You have vaginal bleeding between periods. Summary  Bacterial vaginosis is a vaginal infection that occurs when the normal balance of bacteria in the vagina is disrupted.  Because bacterial vaginosis increases your risk for STIs (sexually transmitted infections), getting treated can help reduce your risk for chlamydia, gonorrhea, herpes, and HIV (human immunodeficiency virus). Treatment is also important for preventing complications in pregnant women, because the condition can cause an early (premature) delivery.  This condition is treated with antibiotic medicines. These may be given as a pill, a vaginal cream, or a medicine that is put into the vagina (suppository). This information is not intended to replace advice given to you by your health care provider. Make sure you discuss any questions you have with your health care provider. Document Released: 11/08/2005 Document Revised: 10/21/2017 Document Reviewed: 07/24/2016 Elsevier Patient Education  2020 [employer]

## 2019-06-12 LAB — WET PREP FOR TRICH, YEAST, CLUE

## 2019-06-12 LAB — C. TRACHOMATIS/N. GONORRHOEAE RNA
C. trachomatis RNA, TMA: NOT DETECTED
N. gonorrhoeae RNA, TMA: NOT DETECTED

## 2019-06-22 ENCOUNTER — Ambulatory Visit: Payer: Self-pay | Admitting: Surgery

## 2019-06-22 NOTE — H&P (Signed)
Surgical H&P CC: morbid obesity  HPI: this is a very pleasant 33 year old woman who presents for management of severe obesity.  She was initially evaluated by my partner in March of last year and began the pathway to bariatric pathway.  She completed this and was scheduled for surgery in March of this year, however due to the global pandemic and cancellation of elective surgeries, her case was put on hold.  She attempted to schedule the surgery in June of this year but unfortunately this had to be postponed as well.  She reports no major changes in her health recently.  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 Johna SheriffHoxworth: "The patient is a 33 year old female who presents with obesity. Patient is referred by Dr Leanor KailBonsu for consideration for surgical treatment for morbid obesity. The patient gives a history of progressive obesity since high school despite multiple attempts at medical management. In high school and then again in college she was able to lose almost 50 pounds with Weight Watchers and diet and exercise but then has experienced progressive weight regain. Patient is currently a Editor, commissioningmath teacher doubly high school. In recent years she has had much less success with nonsurgical efforts at weight loss. Her current weight of 283 pounds with BMI of 48.6 to is her maximum. 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 Significant co-morbid illnesses have developed including foot pain which has required surgery. Diabetes runs in her family and she is concerned about her long-term health. The patient has been to our initial information seminar,  researched surgical options thoroughly, and is interested in sleeve gastrectomy. On questioning she describes some occasional reflux symptoms but has not been on any medication either prescription or over-the-counter for this.Marland Kitchen.Marland Kitchen.Patient with progressive morbid obesity unresponsive to multiple efforts at medical management who presents with a BMI of 48.6 and comorbidities of chronic foot pain. I believe there would be very significant medical benefit from surgical weight loss. After our discussion of surgical options currently available the patient has decided to proceed with laparoscopic sleeve gastrectomy due to the less extensive nature of the surgery and fewer long-term side effects. She does have some reflux symptoms but these are not severe and she has not taking any medication for them. We have discussed the nature of the problem and the risks of remaining morbidly obese. We discussed laparoscopic sleeve gastrectomy in detail including the nature of the procedure, expected hospitalization and recovery, and major risks of anesthetic complications, bleeding, blood clots and pulmonary emboli, leakage and infection and long-term risks of stricture, , bowel obstruction, reflux, nutritional deficiencies, and failure to lose weight or weight regain. We discussed these problems could lead to death. We discussed that the procedure is not reversible. We discussed possible need for conversion to gastric bypass or other procedure. The patient was given a complete consent form to review and all questions were answered. We will go ahead with preoperative evaluation including psychological and nutrition evaluations, UGI series, and routine lab and x-rays. I will see the patient back following these studies."  Allergies  Allergen Reactions  . Penicillins Anaphylaxis and Swelling    Did it involve swelling  of the face/tongue/throat, SOB, or low BP? Yes Did it involve sudden or severe rash/hives, skin peeling, or any  reaction on the inside of your mouth or nose? No Did you need to seek medical attention at a hospital or doctor's office? Yes When did it last happen?childhood allergy If all above answers are "NO", may proceed with cephalosporin use.   [name] [Cefaclor] Swelling    Past Medical History:  Diagnosis Date  . Allergy   . Asthma     Past Surgical History:  Procedure Laterality Date  . COSMETIC SURGERY  2012  . SKIN LESION EXCISION Right    Foot  . TONSILLECTOMY      Family History  Problem Relation Age of Onset  . Diabetes Father   . Hypertension Father   . Heart disease Father     Social History   Socioeconomic History  . Marital status: Single    Spouse name: Not on file  . Number of children: Not on file  . Years of education: Not on file  . Highest education level: Not on file  Occupational History  . Not on file  Social Needs  . Financial resource strain: Not on file  . Food insecurity    Worry: Not on file    Inability: Not on file  . Transportation needs    Medical: Not on file    Non-medical: Not on file  Tobacco Use  . Smoking status: Never Smoker  . Smokeless tobacco: Never Used  Substance and Sexual Activity  . Alcohol use: Not Currently    Alcohol/week: 0.0 standard drinks  . Drug use: No  . Sexual activity: Yes    Partners: Male    Birth control/protection: Coitus interruptus, None  Lifestyle  . Physical activity    Days per week: Not on file    Minutes per session: Not on file  . Stress: Not on file  Relationships  . Social Herbalist on phone: Not on file    Gets together: Not on file    Attends religious service: Not on file    Active member of club or organization: Not on file    Attends meetings of clubs or organizations: Not on file    Relationship status: Not on file  Other Topics Concern  . Not on file  Social History Narrative  . Not on file    Current Outpatient Medications on File Prior to Visit   Medication Sig Dispense Refill  . cetirizine (ZYRTEC) 10 MG tablet Take 10 mg by mouth daily as needed for allergies.    . diphenhydrAMINE (BENADRYL) 25 MG tablet Take 25 mg by mouth daily as needed for itching.    . fluconazole (DIFLUCAN) 150 MG tablet One tablet today and repeat in 3 days 2 tablet 0  . fluticasone (FLONASE) 50 MCG/ACT nasal spray Place 1 spray into both nostrils daily as needed for allergies or rhinitis.    Marland Kitchen metroNIDAZOLE (FLAGYL) 500 MG tablet Take 1 tablet (500 mg total) by mouth 2 (two) times daily. 14 tablet 0  . traZODone (DESYREL) 50 MG tablet Take 50 mg by mouth at bedtime.     No current facility-administered medications on file prior to visit.     Review of Systems: a complete, 10pt review of systems was completed with pertinent positives and negatives as documented in the HPI  Physical Exam: Vitals (Sun[facility]; 06/22/2019 11:27 AM) 06/22/2019 11:26 AM Weight: 290.5 lb Height: 64in Body  Surface Area: 2.29 m Body Mass Index: 49.86 kg/m  Temp.: 97.6F(Temporal)  Pulse: 97 (Regular)  BP: 118/82 (Sitting, Left Arm, Standard) Gen: alert and well appearing Eye: extraocular motion intact, no scleral icterus ENT: moist mucus membranes, dentition intact Neck: no mass or thyromegaly Chest: unlabored respirations, symmetrical air entry, clear bilaterally CV: regular rate and rhythm, no pedal edema Abdomen: soft, nontender, nondistended. No mass or organomegaly MSK: strength symmetrical throughout, no deformity Neuro: grossly intact, normal gait Psych: normal mood and affect, appropriate insight Skin: warm and dry, no rash or lesion on limited exam   CBC Latest Ref Rng & Units 02/07/2019 11/18/2016 05/03/2012  WBC 4.0 - 10.5 K/uL 8.5 6.8 5.3  Hemoglobin 12.0 - 15.0 g/dL 13.7 13.3 13.5  Hematocrit 36.0 - 46.0 % 46.7(H) 43.4 42.0  Platelets 150 - 400 K/uL 352 309 288    CMP Latest Ref Rng & Units 11/18/2016 05/03/2012  Glucose 65 - 99 mg/dL  76 71  BUN 7 - 25 mg/dL 26(H) -  Creatinine 0.50 - 1.10 mg/dL 0.86 -  Sodium 135 - 146 mmol/L 138 -  Potassium 3.5 - 5.3 mmol/L 4.6 -  Chloride 98 - 110 mmol/L 107 -  CO2 20 - 31 mmol/L 23 -  Calcium 8.6 - 10.2 mg/dL 8.5(L) -  Total Protein 6.1 - 8.1 g/dL 6.4 -  Total Bilirubin 0.2 - 1.2 mg/dL 0.3 -  Alkaline Phos 33 - 115 U/L 63 -  AST 10 - 30 U/L 15 -  ALT 6 - 29 U/L 17 -    No results found for: INR, PROTIME  Imaging: No results found.  A/P: MORBID OBESITY WITH BMI OF 45.0-49.9, ADULT (E66.01) Story: She remains an excellent candidate for sleeve gastrectomy. We discussed surgery again including risks, difficult perioperative course, and the importance of lifelong behavioral changes. Questions were rapid and answered. We'll proceed with surgery as scheduled on 8/17.   Jeovanni Heuring, MD [facility] [place] Pager [phone]   

## 2019-06-22 NOTE — H&P (View-Only) (Signed)
Surgical H&P CC: morbid obesity  HPI: this is a very pleasant 33 year old woman who presents for management of severe obesity.  She was initially evaluated by my partner in March of last year and began the pathway to bariatric pathway.  She completed this and was scheduled for surgery in March of this year, however due to the global pandemic and cancellation of elective surgeries, her case was put on hold.  She attempted to schedule the surgery in June of this year but unfortunately this had to be postponed as well.  She reports no major changes in her health recently.  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 Johna SheriffHoxworth: "The patient is a 33 year old female who presents with obesity. Patient is referred by Dr Leanor KailBonsu for consideration for surgical treatment for morbid obesity. The patient gives a history of progressive obesity since high school despite multiple attempts at medical management. In high school and then again in college she was able to lose almost 50 pounds with Weight Watchers and diet and exercise but then has experienced progressive weight regain. Patient is currently a Editor, commissioningmath teacher doubly high school. In recent years she has had much less success with nonsurgical efforts at weight loss. Her current weight of 283 pounds with BMI of 48.6 to is her maximum. 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 Significant co-morbid illnesses have developed including foot pain which has required surgery. Diabetes runs in her family and she is concerned about her long-term health. The patient has been to our initial information seminar,  researched surgical options thoroughly, and is interested in sleeve gastrectomy. On questioning she describes some occasional reflux symptoms but has not been on any medication either prescription or over-the-counter for this.Marland Kitchen.Marland Kitchen.Patient with progressive morbid obesity unresponsive to multiple efforts at medical management who presents with a BMI of 48.6 and comorbidities of chronic foot pain. I believe there would be very significant medical benefit from surgical weight loss. After our discussion of surgical options currently available the patient has decided to proceed with laparoscopic sleeve gastrectomy due to the less extensive nature of the surgery and fewer long-term side effects. She does have some reflux symptoms but these are not severe and she has not taking any medication for them. We have discussed the nature of the problem and the risks of remaining morbidly obese. We discussed laparoscopic sleeve gastrectomy in detail including the nature of the procedure, expected hospitalization and recovery, and major risks of anesthetic complications, bleeding, blood clots and pulmonary emboli, leakage and infection and long-term risks of stricture, , bowel obstruction, reflux, nutritional deficiencies, and failure to lose weight or weight regain. We discussed these problems could lead to death. We discussed that the procedure is not reversible. We discussed possible need for conversion to gastric bypass or other procedure. The patient was given a complete consent form to review and all questions were answered. We will go ahead with preoperative evaluation including psychological and nutrition evaluations, UGI series, and routine lab and x-rays. I will see the patient back following these studies."  Allergies  Allergen Reactions  . Penicillins Anaphylaxis and Swelling    Did it involve swelling  of the face/tongue/throat, SOB, or low BP? Yes Did it involve sudden or severe rash/hives, skin peeling, or any  reaction on the inside of your mouth or nose? No Did you need to seek medical attention at a hospital or doctor's office? Yes When did it last happen?childhood allergy If all above answers are "NO", may proceed with cephalosporin use.   [name] [Cefaclor] Swelling    Past Medical History:  Diagnosis Date  . Allergy   . Asthma     Past Surgical History:  Procedure Laterality Date  . COSMETIC SURGERY  2012  . SKIN LESION EXCISION Right    Foot  . TONSILLECTOMY      Family History  Problem Relation Age of Onset  . Diabetes Father   . Hypertension Father   . Heart disease Father     Social History   Socioeconomic History  . Marital status: Single    Spouse name: Not on file  . Number of children: Not on file  . Years of education: Not on file  . Highest education level: Not on file  Occupational History  . Not on file  Social Needs  . Financial resource strain: Not on file  . Food insecurity    Worry: Not on file    Inability: Not on file  . Transportation needs    Medical: Not on file    Non-medical: Not on file  Tobacco Use  . Smoking status: Never Smoker  . Smokeless tobacco: Never Used  Substance and Sexual Activity  . Alcohol use: Not Currently    Alcohol/week: 0.0 standard drinks  . Drug use: No  . Sexual activity: Yes    Partners: Male    Birth control/protection: Coitus interruptus, None  Lifestyle  . Physical activity    Days per week: Not on file    Minutes per session: Not on file  . Stress: Not on file  Relationships  . Social Herbalist on phone: Not on file    Gets together: Not on file    Attends religious service: Not on file    Active member of club or organization: Not on file    Attends meetings of clubs or organizations: Not on file    Relationship status: Not on file  Other Topics Concern  . Not on file  Social History Narrative  . Not on file    Current Outpatient Medications on File Prior to Visit   Medication Sig Dispense Refill  . cetirizine (ZYRTEC) 10 MG tablet Take 10 mg by mouth daily as needed for allergies.    . diphenhydrAMINE (BENADRYL) 25 MG tablet Take 25 mg by mouth daily as needed for itching.    . fluconazole (DIFLUCAN) 150 MG tablet One tablet today and repeat in 3 days 2 tablet 0  . fluticasone (FLONASE) 50 MCG/ACT nasal spray Place 1 spray into both nostrils daily as needed for allergies or rhinitis.    Marland Kitchen metroNIDAZOLE (FLAGYL) 500 MG tablet Take 1 tablet (500 mg total) by mouth 2 (two) times daily. 14 tablet 0  . traZODone (DESYREL) 50 MG tablet Take 50 mg by mouth at bedtime.     No current facility-administered medications on file prior to visit.     Review of Systems: a complete, 10pt review of systems was completed with pertinent positives and negatives as documented in the HPI  Physical Exam: Vitals ([facility]; 06/22/2019 11:27 AM) 06/22/2019 11:26 AM Weight: 290.5 lb Height: 64in Body  Surface Area: 2.29 m Body Mass Index: 49.86 kg/m  Temp.: 97.50F(Temporal)  Pulse: 97 (Regular)  BP: 118/82 (Sitting, Left Arm, Standard) Gen: alert and well appearing Eye: extraocular motion intact, no scleral icterus ENT: moist mucus membranes, dentition intact Neck: no mass or thyromegaly Chest: unlabored respirations, symmetrical air entry, clear bilaterally CV: regular rate and rhythm, no pedal edema Abdomen: soft, nontender, nondistended. No mass or organomegaly MSK: strength symmetrical throughout, no deformity Neuro: grossly intact, normal gait Psych: normal mood and affect, appropriate insight Skin: warm and dry, no rash or lesion on limited exam   CBC Latest Ref Rng & Units 02/07/2019 11/18/2016 05/03/2012  WBC 4.0 - 10.5 K/uL 8.5 6.8 5.3  Hemoglobin 12.0 - 15.0 g/dL 16.113.7 09.613.3 04.513.5  Hematocrit 36.0 - 46.0 % 46.7(H) 43.4 42.0  Platelets 150 - 400 K/uL 352 309 288    CMP Latest Ref Rng & Units 11/18/2016 05/03/2012  Glucose 65 - 99 mg/dL  76 71  BUN 7 - 25 mg/dL 40(J26(H) -  Creatinine 8.110.50 - 1.10 mg/dL 9.140.86 -  Sodium 782135 - 956146 mmol/L 138 -  Potassium 3.5 - 5.3 mmol/L 4.6 -  Chloride 98 - 110 mmol/L 107 -  CO2 20 - 31 mmol/L 23 -  Calcium 8.6 - 10.2 mg/dL 2.1(H8.5(L) -  Total Protein 6.1 - 8.1 g/dL 6.4 -  Total Bilirubin 0.2 - 1.2 mg/dL 0.3 -  Alkaline Phos 33 - 115 U/L 63 -  AST 10 - 30 U/L 15 -  ALT 6 - 29 U/L 17 -    No results found for: INR, PROTIME  Imaging: No results found.  A/P: MORBID OBESITY WITH BMI OF 45.0-49.9, ADULT (E66.01) Story: She remains an excellent candidate for sleeve gastrectomy. We discussed surgery again including risks, difficult perioperative course, and the importance of lifelong behavioral changes. Questions were rapid and answered. We'll proceed with surgery as scheduled on 8/17.   Phylliss Blakeshelsea Connor, MD [facility] [place] Pager [phone]

## 2019-07-04 ENCOUNTER — Telehealth: Payer: Self-pay | Admitting: Skilled Nursing Facility1

## 2019-07-04 NOTE — Telephone Encounter (Signed)
Dietitian called pt to assess their understanding of the pre-op nutrition recommendations through the teach back method to ensure the pts knowledge readiness in preparation for surgery.   Pt sates she has been following a liquid diet for the past 4 days.  Pt states she still does not eat non starchy vegetables.   Pt is coming to get the handouts because she has miss placed them.   Dietitian reeducated the pt on the pre and post op diet.

## 2019-07-04 NOTE — Patient Instructions (Addendum)
YOU NEED TO HAVE A COVID 19 TEST ON____8-13-2020___ @_______ , THIS TEST MUST BE DONE BEFORE SURGERY, COME  [address], [place] , [phone]. ONCE YOUR COVID TEST IS COMPLETED, PLEASE BEGIN THE QUARANTINE INSTRUCTIONS AS OUTLINED IN YOUR HANDOUT.                 Melissa Greene     Your procedure is scheduled on: 07-09-2019    [facility]     Report to SHORT STAY AT 5:30 AM    1 VISITOR IS ALLOWED TO WAIT IN WAITING ROOM  ONLY DAY OF YOUR SURGERY.     Call this number if you have problems the morning of surgery [phone]     Remember: BRUSH YOUR TEETH MORNING OF SURGERY AND RINSE YOUR MOUTH OUT, NO CHEWING GUM CANDY OR MINTS.   MORNING OF SURGERY DRINK:   DRINK 1 G2 drink BEFORE YOU LEAVE HOME, DRINK ALL OF THE  G2 DRINK AT ONE TIME.   NO SOLID FOOD AFTER 600 PM THE NIGHT BEFORE YOUR SURGERY. YOU MAY DRINK CLEAR FLUIDS. THE G2 DRINK YOU DRINK BEFORE YOU LEAVE HOME WILL BE THE LAST FLUIDS YOU DRINK BEFORE SURGERY. DRINK G2 DRINK AT 4:15 AM.     CLEAR LIQUID DIET   Foods Allowed                                                                     Foods Excluded  Coffee and tea, regular and decaf                             liquids that you cannot  Plain Jell-O any favor except red or purple                                           see through such as: Fruit ices (not with fruit pulp)                                     milk, soups, orange juice  Iced Popsicles                                    All solid food Carbonated beverages, regular and diet                                    Cranberry, grape and apple juices Sports drinks like Gatorade Lightly seasoned clear broth or consume(fat free) Sugar, honey syrup  Sample Menu Breakfast                                Lunch  Supper Cranberry juice                    Beef broth                            Chicken broth Jell-O                                      Grape juice                           Apple juice Coffee or tea                        Jell-O                                      Popsicle                                                Coffee or tea                        Coffee or tea  _____________________________________________________________________     Take these medicines the morning of surgery with A SIP OF WATER: zyrtec if needed, Flonase if needed, albuterol inhaler if needed                 You may not have any metal on your body including hair pins and              piercings  Do not wear jewelry, make-up, lotions, powders or perfumes, deodorant             Do not wear nail polish.  Do not shave  48 hours prior to surgery.                Do not bring valuables to the hospital. [facility] IS NOT             RESPONSIBLE   FOR VALUABLES.  Contacts, dentures or bridgework may not be worn into surgery.  Leave suitcase in the car. After surgery it may be brought to your room.     _____________________________________________________________________             [facility] Before surgery, you can play an important role.  Because skin is not sterile, your skin needs to be as free of germs as possible.  You can reduce the number of germs on your skin by washing with CHG (chlorahexidine gluconate) soap before surgery.  CHG is an antiseptic cleaner which kills germs and bonds with the skin to continue killing germs even after washing. Please DO NOT use if you have an allergy to CHG or antibacterial soaps.  If your skin becomes reddened/irritated stop using the CHG and inform your nurse when you arrive at Short Stay. Do not shave (including legs and underarms) for at least 48 hours prior to the first CHG shower.  You may shave your face/neck. Please follow these instructions carefully:  1.  Shower with CHG Soap the  night before surgery and the  morning of Surgery.  2.  If you choose to  wash your hair, wash your hair first as usual with your  normal  shampoo.  3.  After you shampoo, rinse your hair and body thoroughly to remove the  shampoo.                           4.  Use CHG as you would any other liquid soap.  You can apply chg directly  to the skin and wash                       Gently with a scrungie or clean washcloth.  5.  Apply the CHG Soap to your body ONLY FROM THE NECK DOWN.   Do not use on face/ open                           Wound or open sores. Avoid contact with eyes, ears mouth and genitals (private parts).                       Wash face,  Genitals (private parts) with your normal soap.             6.  Wash thoroughly, paying special attention to the area where your surgery  will be performed.  7.  Thoroughly rinse your body with warm water from the neck down.  8.  DO NOT shower/wash with your normal soap after using and rinsing off  the CHG Soap.                9.  Pat yourself dry with a clean towel.            10.  Wear clean pajamas.            11.  Place clean sheets on your bed the night of your first shower and do not  sleep with pets. Day of Surgery : Do not apply any lotions/deodorants the morning of surgery.  Please wear clean clothes to the hospital/surgery center.  FAILURE TO FOLLOW THESE INSTRUCTIONS MAY RESULT IN THE CANCELLATION OF YOUR SURGERY PATIENT SIGNATURE_________________________________  NURSE SIGNATURE__________________________________  ________________________________________________________________________    PAIN IS EXPECTED AFTER SURGERY AND WILL NOT BE COMPLETELY ELIMINATED. AMBULATION AND TYLENOL WILL HELP REDUCE INCISIONAL AND GAS PAIN. MOVEMENT IS KEY!  YOU ARE EXPECTED TO BE OUT OF BED WITHIN 4 HOURS OF ADMISSION TO YOUR PATIENT ROOM.  SITTING IN THE RECLINER THROUGHOUT THE DAY IS IMPORTANT FOR DRINKING FLUIDS AND MOVING GAS THROUGHOUT THE GI TRACT.  COMPRESSION STOCKINGS SHOULD BE WORN [place]  UNLESS YOU ARE WALKING.   INCENTIVE SPIROMETER SHOULD BE USED EVERY HOUR WHILE AWAKE TO DECREASE POST-OPERATIVE COMPLICATIONS SUCH AS PNEUMONIA.  WHEN DISCHARGED HOME, IT IS IMPORTANT TO CONTINUE TO WALK EVERY HOUR AND USE THE INCENTIVE SPIROMETER EVERY HOUR.   Incentive Spirometer  An incentive spirometer is a tool that can help keep your lungs clear and active. This tool measures how well you are filling your lungs with each breath. Taking long deep breaths may help reverse or decrease the chance of developing breathing (pulmonary) problems (especially infection) following:  A long period of time when you are unable to move or be active. BEFORE THE PROCEDURE   If the spirometer includes an indicator to show your best effort,  your nurse or respiratory therapist will set it to a desired goal.  If possible, sit up straight or lean slightly forward. Try not to slouch.  Hold the incentive spirometer in an upright position. INSTRUCTIONS FOR USE  1. Sit on the edge of your bed if possible, or sit up as far as you can in bed or on a chair. 2. Hold the incentive spirometer in an upright position. 3. Breathe out normally. 4. Place the mouthpiece in your mouth and seal your lips tightly around it. 5. Breathe in slowly and as deeply as possible, raising the piston or the ball toward the top of the column. 6. Hold your breath for 3-5 seconds or for as long as possible. Allow the piston or ball to fall to the bottom of the column. 7. Remove the mouthpiece from your mouth and breathe out normally. 8. Rest for a few seconds and repeat Steps 1 through 7 at least 10 times every 1-2 hours when you are awake. Take your time and take a few normal breaths between deep breaths. 9. The spirometer may include an indicator to show your best effort. Use the indicator as a goal to work toward during each repetition. 10. After each set of 10 deep breaths, practice coughing to be sure your lungs are clear. If you  have an incision (the cut made at the time of surgery), support your incision when coughing by placing a pillow or rolled up towels firmly against it. Once you are able to get out of bed, walk around indoors and cough well. You may stop using the incentive spirometer when instructed by your caregiver.  RISKS AND COMPLICATIONS  Take your time so you do not get dizzy or light-headed.  If you are in pain, you may need to take or ask for pain medication before doing incentive spirometry. It is harder to take a deep breath if you are having pain. AFTER USE  Rest and breathe slowly and easily.  It can be helpful to keep track of a log of your progress. Your caregiver can provide you with a simple table to help with this. If you are using the spirometer at home, follow these instructions: SEEK MEDICAL CARE IF:   You are having difficultly using the spirometer.  You have trouble using the spirometer as often as instructed.  Your pain medication is not giving enough relief while using the spirometer.  You develop fever of 100.5 F (38.1 C) or higher. SEEK IMMEDIATE MEDICAL CARE IF:   You cough up bloody sputum that had not been present before.  You develop fever of 102 F (38.9 C) or greater.  You develop worsening pain at or near the incision site. MAKE SURE YOU:   Understand these instructions.  Will watch your condition.  Will get help right away if you are not doing well or get worse. Document Released: 03/21/2007 Document Revised: 01/31/2012 Document Reviewed: 05/22/2007 Exit[employer] Patient Information 2014 [employer], [place].   ________________________________________________________________________  WHAT IS A BLOOD TRANSFUSION? Blood Transfusion Information  A transfusion is the replacement of blood or some of its parts. Blood is made up of multiple cells which provide different functions.  Red blood cells carry oxygen and are used for blood loss replacement.  White blood  cells fight against infection.  Platelets control bleeding.  Plasma helps clot blood.  Other blood products are available for specialized needs, such as hemophilia or other clotting disorders. BEFORE THE TRANSFUSION  Who gives blood for transfusions?  Healthy volunteers who are fully evaluated to make sure their blood is safe. This is blood bank blood. Transfusion therapy is the safest it has ever been in the practice of medicine. Before blood is taken from a donor, a complete history is taken to make sure that person has no history of diseases nor engages in risky social behavior (examples are intravenous drug use or sexual activity with multiple partners). The donor's travel history is screened to minimize risk of transmitting infections, such as malaria. The donated blood is tested for signs of infectious diseases, such as HIV and hepatitis. The blood is then tested to be sure it is compatible with you in order to minimize the chance of a transfusion reaction. If you or a relative donates blood, this is often done in anticipation of surgery and is not appropriate for emergency situations. It takes many days to process the donated blood. RISKS AND COMPLICATIONS Although transfusion therapy is very safe and saves many lives, the main dangers of transfusion include:   Getting an infectious disease.  Developing a transfusion reaction. This is an allergic reaction to something in the blood you were given. Every precaution is taken to prevent this. The decision to have a blood transfusion has been considered carefully by your caregiver before blood is given. Blood is not given unless the benefits outweigh the risks. AFTER THE TRANSFUSION  Right after receiving a blood transfusion, you will usually feel much better and more energetic. This is especially true if your red blood cells have gotten low (anemic). The transfusion raises the level of the red blood cells which carry oxygen, and this usually  causes an energy increase.  The nurse administering the transfusion will monitor you carefully for complications. HOME CARE INSTRUCTIONS  No special instructions are needed after a transfusion. You may find your energy is better. Speak with your caregiver about any limitations on activity for underlying diseases you may have. SEEK MEDICAL CARE IF:   Your condition is not improving after your transfusion.  You develop redness or irritation at the intravenous (IV) site. SEEK IMMEDIATE MEDICAL CARE IF:  Any of the following symptoms occur over the next 12 hours:  Shaking chills.  You have a temperature by mouth above 102 F (38.9 C), not controlled by medicine.  Chest, back, or muscle pain.  People around you feel you are not acting correctly or are confused.  Shortness of breath or difficulty breathing.  Dizziness and fainting.  You get a rash or develop hives.  You have a decrease in urine output.  Your urine turns a dark color or changes to pink, red, or brown. Any of the following symptoms occur over the next 10 days:  You have a temperature by mouth above 102 F (38.9 C), not controlled by medicine.  Shortness of breath.  Weakness after normal activity.  The white part of the eye turns yellow (jaundice).  You have a decrease in the amount of urine or are urinating less often.  Your urine turns a dark color or changes to pink, red, or brown. Document Released: 11/05/2000 Document Revised: 01/31/2012 Document Reviewed: 06/24/2008 [facility] Patient Information 2014 [place], [place].  _______________________________________________________________________

## 2019-07-05 ENCOUNTER — Other Ambulatory Visit: Payer: Self-pay

## 2019-07-05 ENCOUNTER — Encounter (HOSPITAL_COMMUNITY)
Admission: RE | Admit: 2019-07-05 | Discharge: 2019-07-05 | Disposition: A | Discharge status: Home / Self Care | Payer: BC Managed Care – PPO | Source: Ambulatory Visit | Attending: Surgery | Admitting: Surgery

## 2019-07-05 ENCOUNTER — Encounter (HOSPITAL_COMMUNITY): Payer: Self-pay

## 2019-07-05 ENCOUNTER — Other Ambulatory Visit (HOSPITAL_COMMUNITY)
Admission: RE | Admit: 2019-07-05 | Discharge: 2019-07-05 | Disposition: A | Discharge status: Home / Self Care | Payer: BC Managed Care – PPO | Source: Ambulatory Visit | Attending: Surgery | Admitting: Surgery

## 2019-07-05 DIAGNOSIS — Z20828 Contact with and (suspected) exposure to other viral communicable diseases: Secondary | ICD-10-CM | POA: Diagnosis not present

## 2019-07-05 DIAGNOSIS — Z01812 Encounter for preprocedural laboratory examination: Secondary | ICD-10-CM | POA: Insufficient documentation

## 2019-07-05 HISTORY — DX: Anxiety disorder, unspecified: F41.9

## 2019-07-05 HISTORY — DX: Depression, unspecified: F32.A

## 2019-07-05 LAB — CBC WITH DIFFERENTIAL/PLATELET
Abs Immature Granulocytes: 0.02 10*3/uL (ref 0.00–0.07)
Basophils Absolute: 0 10*3/uL (ref 0.0–0.1)
Basophils Relative: 1 %
Eosinophils Absolute: 0.2 10*3/uL (ref 0.0–0.5)
Eosinophils Relative: 2 %
HCT: 47.9 % — ABNORMAL HIGH (ref 36.0–46.0)
Hemoglobin: 14.4 g/dL (ref 12.0–15.0)
Immature Granulocytes: 0 %
Lymphocytes Relative: 38 %
Lymphs Abs: 2.9 10*3/uL (ref 0.7–4.0)
MCH: 24.3 pg — ABNORMAL LOW (ref 26.0–34.0)
MCHC: 30.1 g/dL (ref 30.0–36.0)
MCV: 80.8 fL (ref 80.0–100.0)
Monocytes Absolute: 0.7 10*3/uL (ref 0.1–1.0)
Monocytes Relative: 10 %
Neutro Abs: 3.8 10*3/uL (ref 1.7–7.7)
Neutrophils Relative %: 49 %
Platelets: 363 10*3/uL (ref 150–400)
RBC: 5.93 MIL/uL — ABNORMAL HIGH (ref 3.87–5.11)
RDW: 14.8 % (ref 11.5–15.5)
WBC: 7.6 10*3/uL (ref 4.0–10.5)
nRBC: 0 % (ref 0.0–0.2)

## 2019-07-05 LAB — COMPREHENSIVE METABOLIC PANEL
ALT: 33 U/L (ref 0–44)
AST: 21 U/L (ref 15–41)
Albumin: 3.9 g/dL (ref 3.5–5.0)
Alkaline Phosphatase: 80 U/L (ref 38–126)
Anion gap: 9 (ref 5–15)
BUN: 19 mg/dL (ref 6–20)
CO2: 25 mmol/L (ref 22–32)
Calcium: 9.3 mg/dL (ref 8.9–10.3)
Chloride: 104 mmol/L (ref 98–111)
Creatinine, Ser: 0.92 mg/dL (ref 0.44–1.00)
GFR calc Af Amer: >60 mL/min (ref >60)
GFR calc non Af Amer: >60 mL/min (ref >60)
Glucose, Bld: 96 mg/dL (ref 70–99)
Potassium: 4.4 mmol/L (ref 3.5–5.1)
Sodium: 138 mmol/L (ref 135–145)
Total Bilirubin: 0.4 mg/dL (ref 0.3–1.2)
Total Protein: 8.1 g/dL (ref 6.5–8.1)

## 2019-07-05 LAB — SARS CORONAVIRUS 2 (TAT 6-24 HRS): SARS Coronavirus 2: NEGATIVE

## 2019-07-06 LAB — NO BLOOD PRODUCTS

## 2019-07-08 MED ORDER — GENTAMICIN SULFATE 40 MG/ML IJ SOLN
5.0000 mg/kg | INTRAVENOUS | Status: AC
  Administered 2019-07-09: 07:00:00 430 mg via INTRAVENOUS
  Filled 2019-07-08 (×2): qty 10.75

## 2019-07-08 MED ORDER — GENTAMICIN SULFATE 40 MG/ML IJ SOLN
5.0000 mg/kg | INTRAVENOUS | Status: DC
  Filled 2019-07-08: qty 10.75

## 2019-07-08 MED ORDER — BUPIVACAINE LIPOSOME 1.3 % IJ SUSP
20.0000 mL | Freq: Once | INTRAMUSCULAR | Status: DC
  Filled 2019-07-08: qty 20

## 2019-07-08 NOTE — Anesthesia Preprocedure Evaluation (Addendum)
Anesthesia Evaluation  Patient identified by MRN, date of birth, ID band Patient awake    Reviewed: Allergy & Precautions, NPO status , Patient's Chart, lab work & pertinent test results  Airway Mallampati: II       Dental no notable dental hx. (+) Teeth Intact   Pulmonary    Pulmonary exam normal breath sounds clear to auscultation       Cardiovascular negative cardio ROS Normal cardiovascular exam Rhythm:Regular Rate:Normal     Neuro/Psych PSYCHIATRIC DISORDERS Anxiety Depression negative neurological ROS     GI/Hepatic negative GI ROS, Neg liver ROS,   Endo/Other  Morbid obesity  Renal/GU negative Renal ROS  negative genitourinary   Musculoskeletal negative musculoskeletal ROS (+)   Abdominal (+) + obese,   Peds  Hematology   Anesthesia Other Findings   Reproductive/Obstetrics negative OB ROS                            Anesthesia Physical Anesthesia Plan  ASA: III  Anesthesia Plan: General   Post-op Pain Management:    Induction: Intravenous  PONV Risk Score and Plan: Ondansetron, Dexamethasone, Midazolam and Scopolamine patch - Pre-op  Airway Management Planned: Oral ETT  Additional Equipment:   Intra-op Plan:   Post-operative Plan: Extubation in OR  Informed Consent: I have reviewed the patients History and Physical, chart, labs and discussed the procedure including the risks, benefits and alternatives for the proposed anesthesia with the patient or authorized representative who has indicated his/her understanding and acceptance.     Dental advisory given  Plan Discussed with: CRNA  Anesthesia Plan Comments:        Anesthesia Quick Evaluation

## 2019-07-09 ENCOUNTER — Encounter (HOSPITAL_COMMUNITY): Payer: Self-pay | Admitting: *Deleted

## 2019-07-09 ENCOUNTER — Encounter (HOSPITAL_COMMUNITY)
Admission: RE | Disposition: A | Discharge status: Home / Self Care | Payer: Self-pay | Source: Other Acute Inpatient Hospital | Attending: Surgery

## 2019-07-09 ENCOUNTER — Inpatient Hospital Stay (HOSPITAL_COMMUNITY): Payer: BC Managed Care – PPO | Admitting: Physician Assistant

## 2019-07-09 ENCOUNTER — Other Ambulatory Visit: Payer: Self-pay

## 2019-07-09 ENCOUNTER — Inpatient Hospital Stay (HOSPITAL_COMMUNITY)
Admission: RE | Admit: 2019-07-09 | Discharge: 2019-07-11 | DRG: 621 | Disposition: A | Discharge status: Home / Self Care | Payer: BC Managed Care – PPO | Source: Other Acute Inpatient Hospital | Attending: Surgery | Admitting: Surgery

## 2019-07-09 ENCOUNTER — Inpatient Hospital Stay (HOSPITAL_COMMUNITY): Payer: BC Managed Care – PPO | Admitting: Certified Registered Nurse Anesthetist

## 2019-07-09 DIAGNOSIS — Z881 Allergy status to other antibiotic agents status: Secondary | ICD-10-CM | POA: Diagnosis not present

## 2019-07-09 DIAGNOSIS — Z880 Allergy status to penicillin: Secondary | ICD-10-CM | POA: Diagnosis not present

## 2019-07-09 DIAGNOSIS — Z8249 Family history of ischemic heart disease and other diseases of the circulatory system: Secondary | ICD-10-CM

## 2019-07-09 DIAGNOSIS — Z833 Family history of diabetes mellitus: Secondary | ICD-10-CM

## 2019-07-09 DIAGNOSIS — Z6841 Body Mass Index (BMI) 40.0 and over, adult: Secondary | ICD-10-CM

## 2019-07-09 DIAGNOSIS — Z7951 Long term (current) use of inhaled steroids: Secondary | ICD-10-CM

## 2019-07-09 HISTORY — PX: LAPAROSCOPIC GASTRIC SLEEVE RESECTION: SHX5895

## 2019-07-09 LAB — PREGNANCY, URINE: Preg Test, Ur: NEGATIVE

## 2019-07-09 SURGERY — GASTRECTOMY, SLEEVE, LAPAROSCOPIC
Anesthesia: General | Site: Abdomen

## 2019-07-09 MED ORDER — HYDROMORPHONE HCL 1 MG/ML IJ SOLN: 0.5000 mg | INTRAMUSCULAR | Status: DC | PRN

## 2019-07-09 MED ORDER — ONDANSETRON HCL 4 MG/2ML IJ SOLN
INTRAMUSCULAR | Status: DC | PRN
  Administered 2019-07-09: 4 mg via INTRAVENOUS

## 2019-07-09 MED ORDER — OXYCODONE HCL 5 MG/5ML PO SOLN
5.0000 mg | Freq: Four times a day (QID) | ORAL | Status: DC | PRN
  Administered 2019-07-10: 5 mg via ORAL
  Filled 2019-07-09: qty 5

## 2019-07-09 MED ORDER — LACTATED RINGERS IR SOLN
Status: DC | PRN
  Administered 2019-07-09: 1000 mL

## 2019-07-09 MED ORDER — ROCURONIUM BROMIDE 50 MG/5ML IV SOSY
PREFILLED_SYRINGE | INTRAVENOUS | Status: DC | PRN
  Administered 2019-07-09: 60 mg via INTRAVENOUS

## 2019-07-09 MED ORDER — HYDRALAZINE HCL 20 MG/ML IJ SOLN: 10.0000 mg | INTRAMUSCULAR | Status: DC | PRN

## 2019-07-09 MED ORDER — PANTOPRAZOLE SODIUM 40 MG IV SOLR
40.0000 mg | Freq: Every day | INTRAVENOUS | Status: DC
  Administered 2019-07-09 – 2019-07-10 (×2): 40 mg via INTRAVENOUS
  Filled 2019-07-09 (×2): qty 40

## 2019-07-09 MED ORDER — ENSURE MAX PROTEIN PO LIQD
2.0000 [oz_av] | ORAL | Status: DC
  Administered 2019-07-10 – 2019-07-11 (×4): 2 [oz_av] via ORAL

## 2019-07-09 MED ORDER — METHOCARBAMOL 1000 MG/10ML IJ SOLN
500.0000 mg | Freq: Four times a day (QID) | INTRAVENOUS | Status: DC | PRN
  Administered 2019-07-10: 10:00:00 500 mg via INTRAVENOUS
  Filled 2019-07-09: qty 5
  Filled 2019-07-09: qty 500
  Filled 2019-07-09: qty 5

## 2019-07-09 MED ORDER — DEXAMETHASONE SODIUM PHOSPHATE 10 MG/ML IJ SOLN
INTRAMUSCULAR | Status: DC | PRN
  Administered 2019-07-09: 6 mg via INTRAVENOUS

## 2019-07-09 MED ORDER — TRAMADOL HCL 50 MG PO TABS
50.0000 mg | ORAL_TABLET | Freq: Four times a day (QID) | ORAL | Status: DC | PRN
  Administered 2019-07-09: 50 mg via ORAL
  Filled 2019-07-09: qty 1

## 2019-07-09 MED ORDER — SCOPOLAMINE 1 MG/3DAYS TD PT72
1.0000 | MEDICATED_PATCH | TRANSDERMAL | Status: DC
  Administered 2019-07-09: 1.5 mg via TRANSDERMAL
  Filled 2019-07-09: qty 1

## 2019-07-09 MED ORDER — LIDOCAINE 2% (20 MG/ML) 5 ML SYRINGE
INTRAMUSCULAR | Status: DC | PRN
  Administered 2019-07-09: 100 mg via INTRAVENOUS

## 2019-07-09 MED ORDER — BUPIVACAINE-EPINEPHRINE (PF) 0.25% -1:200000 IJ SOLN
INTRAMUSCULAR | Status: AC
  Filled 2019-07-09: qty 30

## 2019-07-09 MED ORDER — METOCLOPRAMIDE HCL 5 MG/ML IJ SOLN: 10.0000 mg | Freq: Four times a day (QID) | INTRAMUSCULAR | Status: DC | PRN

## 2019-07-09 MED ORDER — SUGAMMADEX SODIUM 500 MG/5ML IV SOLN
INTRAVENOUS | Status: AC
  Filled 2019-07-09: qty 5

## 2019-07-09 MED ORDER — FENTANYL CITRATE (PF) 250 MCG/5ML IJ SOLN
INTRAMUSCULAR | Status: AC
  Filled 2019-07-09: qty 5

## 2019-07-09 MED ORDER — TRAZODONE HCL 50 MG PO TABS: 50.0000 mg | ORAL_TABLET | Freq: Every evening | ORAL | Status: DC | PRN

## 2019-07-09 MED ORDER — PROPOFOL 10 MG/ML IV BOLUS
INTRAVENOUS | Status: AC
  Filled 2019-07-09: qty 20

## 2019-07-09 MED ORDER — DEXAMETHASONE SODIUM PHOSPHATE 10 MG/ML IJ SOLN
INTRAMUSCULAR | Status: AC
  Filled 2019-07-09: qty 1

## 2019-07-09 MED ORDER — SUGAMMADEX SODIUM 200 MG/2ML IV SOLN
INTRAVENOUS | Status: DC | PRN
  Administered 2019-07-09: 300 mg via INTRAVENOUS

## 2019-07-09 MED ORDER — ACETAMINOPHEN 500 MG PO TABS
1000.0000 mg | ORAL_TABLET | Freq: Three times a day (TID) | ORAL | Status: DC
  Administered 2019-07-10 – 2019-07-11 (×3): 1000 mg via ORAL
  Filled 2019-07-09 (×3): qty 2

## 2019-07-09 MED ORDER — MEPERIDINE HCL 50 MG/ML IJ SOLN: 6.2500 mg | INTRAMUSCULAR | Status: DC | PRN

## 2019-07-09 MED ORDER — LIDOCAINE HCL 2 % IJ SOLN
INTRAMUSCULAR | Status: AC
  Filled 2019-07-09: qty 20

## 2019-07-09 MED ORDER — KETAMINE HCL 10 MG/ML IJ SOLN
INTRAMUSCULAR | Status: AC
  Filled 2019-07-09: qty 1

## 2019-07-09 MED ORDER — KETAMINE HCL 10 MG/ML IJ SOLN
INTRAMUSCULAR | Status: DC | PRN
  Administered 2019-07-09: 30 mg via INTRAVENOUS

## 2019-07-09 MED ORDER — MIDAZOLAM HCL 2 MG/2ML IJ SOLN
INTRAMUSCULAR | Status: AC
  Filled 2019-07-09: qty 2

## 2019-07-09 MED ORDER — ONDANSETRON HCL 4 MG/2ML IJ SOLN
4.0000 mg | INTRAMUSCULAR | Status: DC | PRN
  Administered 2019-07-09: 4 mg via INTRAVENOUS
  Filled 2019-07-09: qty 2

## 2019-07-09 MED ORDER — GABAPENTIN 100 MG PO CAPS
200.0000 mg | ORAL_CAPSULE | Freq: Two times a day (BID) | ORAL | Status: DC
  Administered 2019-07-09 – 2019-07-10 (×2): 200 mg via ORAL
  Filled 2019-07-09 (×2): qty 2

## 2019-07-09 MED ORDER — LIDOCAINE 2% (20 MG/ML) 5 ML SYRINGE
INTRAMUSCULAR | Status: AC
  Filled 2019-07-09: qty 5

## 2019-07-09 MED ORDER — ENOXAPARIN SODIUM 30 MG/0.3ML ~~LOC~~ SOLN
30.0000 mg | Freq: Two times a day (BID) | SUBCUTANEOUS | Status: DC
  Administered 2019-07-09 – 2019-07-11 (×4): 30 mg via SUBCUTANEOUS
  Filled 2019-07-09 (×4): qty 0.3

## 2019-07-09 MED ORDER — ACETAMINOPHEN 160 MG/5ML PO SOLN
1000.0000 mg | Freq: Three times a day (TID) | ORAL | Status: DC
  Administered 2019-07-09 (×2): 1000 mg via ORAL
  Filled 2019-07-09 (×3): qty 40.6

## 2019-07-09 MED ORDER — BUPIVACAINE LIPOSOME 1.3 % IJ SUSP
INTRAMUSCULAR | Status: DC | PRN
  Administered 2019-07-09: 20 mL

## 2019-07-09 MED ORDER — CLINDAMYCIN PHOSPHATE 900 MG/50ML IV SOLN
900.0000 mg | INTRAVENOUS | Status: AC
  Administered 2019-07-09: 07:00:00 900 mg via INTRAVENOUS
  Filled 2019-07-09: qty 50

## 2019-07-09 MED ORDER — DOCUSATE SODIUM 100 MG PO CAPS
100.0000 mg | ORAL_CAPSULE | Freq: Two times a day (BID) | ORAL | Status: DC
  Administered 2019-07-09 – 2019-07-11 (×4): 100 mg via ORAL
  Filled 2019-07-09 (×4): qty 1

## 2019-07-09 MED ORDER — SIMETHICONE 80 MG PO CHEW
80.0000 mg | CHEWABLE_TABLET | Freq: Four times a day (QID) | ORAL | Status: DC | PRN
  Administered 2019-07-09 – 2019-07-11 (×2): 80 mg via ORAL
  Filled 2019-07-09 (×2): qty 1

## 2019-07-09 MED ORDER — ALBUTEROL SULFATE HFA 108 (90 BASE) MCG/ACT IN AERS: 2.0000 | INHALATION_SPRAY | Freq: Four times a day (QID) | RESPIRATORY_TRACT | Status: DC | PRN

## 2019-07-09 MED ORDER — TRAMADOL 5 MG/ML ORAL SUSPENSION: 50.0000 mg | Freq: Four times a day (QID) | ORAL | Status: DC | PRN

## 2019-07-09 MED ORDER — GABAPENTIN 300 MG PO CAPS
300.0000 mg | ORAL_CAPSULE | ORAL | Status: AC
  Administered 2019-07-09: 300 mg via ORAL
  Filled 2019-07-09: qty 1

## 2019-07-09 MED ORDER — LACTATED RINGERS IV SOLN
INTRAVENOUS | Status: DC
  Administered 2019-07-09: 06:00:00 via INTRAVENOUS

## 2019-07-09 MED ORDER — LIDOCAINE 2% (20 MG/ML) 5 ML SYRINGE
INTRAMUSCULAR | Status: DC | PRN
  Administered 2019-07-09: 1.5 mg/kg/h via INTRAVENOUS

## 2019-07-09 MED ORDER — CHLORHEXIDINE GLUCONATE 4 % EX LIQD: 60.0000 mL | Freq: Once | CUTANEOUS | Status: DC

## 2019-07-09 MED ORDER — PROPOFOL 10 MG/ML IV BOLUS
INTRAVENOUS | Status: DC | PRN
  Administered 2019-07-09: 200 mg via INTRAVENOUS

## 2019-07-09 MED ORDER — ONDANSETRON HCL 4 MG/2ML IJ SOLN
INTRAMUSCULAR | Status: AC
  Filled 2019-07-09: qty 2

## 2019-07-09 MED ORDER — HYDROMORPHONE HCL 1 MG/ML IJ SOLN
INTRAMUSCULAR | Status: AC
  Administered 2019-07-09: 12:00:00
  Filled 2019-07-09: qty 1

## 2019-07-09 MED ORDER — HYDROMORPHONE HCL 1 MG/ML IJ SOLN
0.2500 mg | INTRAMUSCULAR | Status: DC | PRN
  Administered 2019-07-09 (×2): 0.5 mg via INTRAVENOUS

## 2019-07-09 MED ORDER — DEXAMETHASONE SODIUM PHOSPHATE 4 MG/ML IJ SOLN: 4.0000 mg | INTRAMUSCULAR | Status: DC

## 2019-07-09 MED ORDER — BUPIVACAINE-EPINEPHRINE 0.25% -1:200000 IJ SOLN
INTRAMUSCULAR | Status: DC | PRN
  Administered 2019-07-09: 30 mL

## 2019-07-09 MED ORDER — SODIUM CHLORIDE 0.9 % IV SOLN
INTRAVENOUS | Status: DC
  Administered 2019-07-09 – 2019-07-10 (×4): via INTRAVENOUS

## 2019-07-09 MED ORDER — SUCCINYLCHOLINE CHLORIDE 200 MG/10ML IV SOSY
PREFILLED_SYRINGE | INTRAVENOUS | Status: AC
  Filled 2019-07-09: qty 10

## 2019-07-09 MED ORDER — ROCURONIUM BROMIDE 10 MG/ML (PF) SYRINGE
PREFILLED_SYRINGE | INTRAVENOUS | Status: AC
  Filled 2019-07-09: qty 10

## 2019-07-09 MED ORDER — METOPROLOL TARTRATE 5 MG/5ML IV SOLN: 5.0000 mg | Freq: Four times a day (QID) | INTRAVENOUS | Status: DC | PRN

## 2019-07-09 MED ORDER — 0.9 % SODIUM CHLORIDE (POUR BTL) OPTIME
TOPICAL | Status: DC | PRN
  Administered 2019-07-09: 1000 mL

## 2019-07-09 MED ORDER — ALBUTEROL SULFATE (2.5 MG/3ML) 0.083% IN NEBU: 2.5000 mg | INHALATION_SOLUTION | Freq: Four times a day (QID) | RESPIRATORY_TRACT | Status: DC | PRN

## 2019-07-09 MED ORDER — TRAZODONE HCL 50 MG PO TABS: 50.0000 mg | ORAL_TABLET | Freq: Every day | ORAL | Status: DC

## 2019-07-09 MED ORDER — APREPITANT 40 MG PO CAPS
40.0000 mg | ORAL_CAPSULE | ORAL | Status: AC
  Administered 2019-07-09: 40 mg via ORAL
  Filled 2019-07-09: qty 1

## 2019-07-09 MED ORDER — KETOROLAC TROMETHAMINE 15 MG/ML IJ SOLN
15.0000 mg | INTRAMUSCULAR | Status: DC
  Administered 2019-07-09: 07:00:00 15 mg via INTRAVENOUS
  Filled 2019-07-09: qty 1

## 2019-07-09 MED ORDER — FENTANYL CITRATE (PF) 250 MCG/5ML IJ SOLN
INTRAMUSCULAR | Status: DC | PRN
  Administered 2019-07-09: 50 ug via INTRAVENOUS
  Administered 2019-07-09: 100 ug via INTRAVENOUS

## 2019-07-09 MED ORDER — ACETAMINOPHEN 500 MG PO TABS
1000.0000 mg | ORAL_TABLET | ORAL | Status: AC
  Administered 2019-07-09: 1000 mg via ORAL
  Filled 2019-07-09: qty 2

## 2019-07-09 MED ORDER — SUCCINYLCHOLINE CHLORIDE 200 MG/10ML IV SOSY
PREFILLED_SYRINGE | INTRAVENOUS | Status: DC | PRN
  Administered 2019-07-09: 120 mg via INTRAVENOUS

## 2019-07-09 MED ORDER — KETOROLAC TROMETHAMINE 30 MG/ML IJ SOLN: 30.0000 mg | Freq: Once | INTRAMUSCULAR | Status: DC | PRN

## 2019-07-09 MED ORDER — KETOROLAC TROMETHAMINE 15 MG/ML IJ SOLN
15.0000 mg | Freq: Three times a day (TID) | INTRAMUSCULAR | Status: DC | PRN
  Administered 2019-07-09 – 2019-07-11 (×3): 15 mg via INTRAVENOUS
  Filled 2019-07-09 (×3): qty 1

## 2019-07-09 MED ORDER — MIDAZOLAM HCL 5 MG/5ML IJ SOLN
INTRAMUSCULAR | Status: DC | PRN
  Administered 2019-07-09: 2 mg via INTRAVENOUS

## 2019-07-09 MED ORDER — PROMETHAZINE HCL 25 MG/ML IJ SOLN
6.2500 mg | INTRAMUSCULAR | Status: DC | PRN
  Administered 2019-07-09: 10:00:00 12.5 mg via INTRAVENOUS

## 2019-07-09 MED ORDER — ENOXAPARIN SODIUM 40 MG/0.4ML ~~LOC~~ SOLN
40.0000 mg | SUBCUTANEOUS | Status: AC
  Administered 2019-07-09: 06:00:00 40 mg via SUBCUTANEOUS
  Filled 2019-07-09 (×2): qty 0.4

## 2019-07-09 MED ORDER — PROMETHAZINE HCL 25 MG/ML IJ SOLN
INTRAMUSCULAR | Status: AC
  Administered 2019-07-09: 12:00:00
  Filled 2019-07-09: qty 1

## 2019-07-09 SURGICAL SUPPLY — 74 items
APPLICATOR COTTON TIP 6 STRL (MISCELLANEOUS) IMPLANT
APPLICATOR COTTON TIP 6IN STRL (MISCELLANEOUS)
APPLIER CLIP ROT 10 11.4 M/L (STAPLE)
APPLIER CLIP ROT 13.4 12 LRG (CLIP)
BAG LAPAROSCOPIC 12 15 PORT 16 (BASKET) IMPLANT
BAG RETRIEVAL 12/15 (BASKET) ×2
BENZOIN TINCTURE PRP APPL 2/3 (GAUZE/BANDAGES/DRESSINGS) ×2 IMPLANT
BLADE SURG SZ11 CARB STEEL (BLADE) ×2 IMPLANT
BNDG ADH 1X3 SHEER STRL LF (GAUZE/BANDAGES/DRESSINGS) ×12 IMPLANT
CABLE HIGH FREQUENCY MONO STRZ (ELECTRODE) ×1 IMPLANT
CHLORAPREP W/TINT 26 (MISCELLANEOUS) ×4 IMPLANT
CLIP APPLIE ROT 10 11.4 M/L (STAPLE) IMPLANT
CLIP APPLIE ROT 13.4 12 LRG (CLIP) IMPLANT
COVER SURGICAL LIGHT HANDLE (MISCELLANEOUS) ×2 IMPLANT
COVER WAND RF STERILE (DRAPES) IMPLANT
DECANTER SPIKE VIAL GLASS SM (MISCELLANEOUS) ×2 IMPLANT
DEVICE SUT QUICK LOAD TK 5 (STAPLE) IMPLANT
DEVICE SUT TI-KNOT TK 5X26 (MISCELLANEOUS) IMPLANT
DRAPE UTILITY XL STRL (DRAPES) ×4 IMPLANT
ELECT REM PT RETURN 15FT ADLT (MISCELLANEOUS) ×2 IMPLANT
GLOVE BIO SURGEON STRL SZ 6 (GLOVE) ×2 IMPLANT
GLOVE BIO SURGEON STRL SZ 6.5 (GLOVE) ×1 IMPLANT
GLOVE BIOGEL PI IND STRL 6.5 (GLOVE) IMPLANT
GLOVE BIOGEL PI INDICATOR 6.5 (GLOVE) ×3
GLOVE INDICATOR 6.5 STRL GRN (GLOVE) ×2 IMPLANT
GLOVE SURG SS PI 7.0 STRL IVOR (GLOVE) ×2 IMPLANT
GLOVE SURG SYN 7.5  E (GLOVE) ×1
GLOVE SURG SYN 7.5 E (GLOVE) ×1 IMPLANT
GLOVE SURG SYN 7.5 PF PI (GLOVE) IMPLANT
GOWN STRL REUS W/TWL LRG LVL3 (GOWN DISPOSABLE) ×3 IMPLANT
GOWN STRL REUS W/TWL XL LVL3 (GOWN DISPOSABLE) ×4 IMPLANT
GRASPER SUT TROCAR 14GX15 (MISCELLANEOUS) ×2 IMPLANT
HEMOSTAT SNOW SURGICEL 2X4 (HEMOSTASIS) ×1 IMPLANT
HOVERMATT SINGLE USE (MISCELLANEOUS) ×2 IMPLANT
KIT BASIN OR (CUSTOM PROCEDURE TRAY) ×2 IMPLANT
KIT TURNOVER KIT A (KITS) IMPLANT
MARKER SKIN DUAL TIP RULER LAB (MISCELLANEOUS) ×2 IMPLANT
NDL SPNL 22GX3.5 QUINCKE BK (NEEDLE) ×1 IMPLANT
NEEDLE SPNL 22GX3.5 QUINCKE BK (NEEDLE) ×2 IMPLANT
PACK UNIVERSAL I (CUSTOM PROCEDURE TRAY) ×2 IMPLANT
RELOAD ENDO STITCH (ENDOMECHANICALS) IMPLANT
RELOAD STAPLE 60 3.6 BLU REG (STAPLE) ×1 IMPLANT
RELOAD STAPLE 60 3.8 GOLD REG (STAPLE) ×1 IMPLANT
RELOAD STAPLE 60 4.1 GRN THCK (STAPLE) IMPLANT
RELOAD STAPLER BLUE 60MM (STAPLE) ×3 IMPLANT
RELOAD STAPLER GOLD 60MM (STAPLE) ×1 IMPLANT
RELOAD STAPLER GREEN 60MM (STAPLE) ×1 IMPLANT
RELOAD SUT TRIPLE-STITCH 2-0 (ENDOMECHANICALS) IMPLANT
SCISSORS LAP 5X45 EPIX DISP (ENDOMECHANICALS) ×2 IMPLANT
SET IRRIG TUBING LAPAROSCOPIC (IRRIGATION / IRRIGATOR) ×2 IMPLANT
SET TUBE SMOKE EVAC HIGH FLOW (TUBING) ×2 IMPLANT
SHEARS HARMONIC ACE PLUS 45CM (MISCELLANEOUS) ×2 IMPLANT
SLEEVE ADV FIXATION 5X100MM (TROCAR) ×4 IMPLANT
SLEEVE GASTRECTOMY 40FR VISIGI (MISCELLANEOUS) ×2 IMPLANT
SOLUTION ANTI FOG 6CC (MISCELLANEOUS) ×2 IMPLANT
SPONGE LAP 18X18 RF (DISPOSABLE) ×2 IMPLANT
STAPLER ECHELON BIOABSB 60 FLE (MISCELLANEOUS) ×8 IMPLANT
STAPLER ECHELON LONG 60 440 (INSTRUMENTS) ×2 IMPLANT
STAPLER RELOAD BLUE 60MM (STAPLE) ×6
STAPLER RELOAD GOLD 60MM (STAPLE) ×2
STAPLER RELOAD GREEN 60MM (STAPLE) ×2
STRIP CLOSURE SKIN 1/2X4 (GAUZE/BANDAGES/DRESSINGS) ×2 IMPLANT
SUT MNCRL AB 4-0 PS2 18 (SUTURE) ×2 IMPLANT
SUT SURGIDAC NAB ES-9 0 48 120 (SUTURE) IMPLANT
SUT VICRYL 0 TIES 12 18 (SUTURE) ×2 IMPLANT
SYR 10ML ECCENTRIC (SYRINGE) ×2 IMPLANT
SYR 20ML LL LF (SYRINGE) ×2 IMPLANT
TOWEL OR 17X26 10 PK STRL BLUE (TOWEL DISPOSABLE) ×2 IMPLANT
TOWEL OR NON WOVEN STRL DISP B (DISPOSABLE) ×2 IMPLANT
TROCAR ADV FIXATION 5X100MM (TROCAR) ×2 IMPLANT
TROCAR BLADELESS 15MM (ENDOMECHANICALS) ×2 IMPLANT
TROCAR BLADELESS OPT 5 100 (ENDOMECHANICALS) ×2 IMPLANT
TUBING CONNECTING 10 (TUBING) ×2 IMPLANT
TUBING ENDO SMARTCAP (MISCELLANEOUS) ×2 IMPLANT

## 2019-07-09 NOTE — Anesthesia Procedure Notes (Signed)
Procedure Name: Intubation Date/Time: 07/09/2019 7:16 AM Performed by: Maxwell Caul, CRNA Pre-anesthesia Checklist: Patient identified, Emergency Drugs available, Suction available and Patient being monitored Patient Re-evaluated:Patient Re-evaluated prior to induction Oxygen Delivery Method: Circle system utilized Preoxygenation: Pre-oxygenation with 100% oxygen Induction Type: IV induction Laryngoscope Size: Mac and 4 Grade View: Grade II Tube type: Oral Tube size: 7.5 mm Number of attempts: 1 Airway Equipment and Method: Stylet Placement Confirmation: ETT inserted through vocal cords under direct vision,  positive ETCO2 and breath sounds checked- equal and bilateral Secured at: 22 cm Tube secured with: Tape Dental Injury: Teeth and Oropharynx as per pre-operative assessment

## 2019-07-09 NOTE — Progress Notes (Signed)
PHARMACY CONSULT FOR:  Risk Assessment for Post-Discharge VTE Following Bariatric Surgery  Post-Discharge VTE Risk Assessment: This patient's probability of 30-day post-discharge VTE is increased due to the factors marked:   Female    Age >/=60 years    BMI >/=50 kg/m2    CHF    Dyspnea at Rest    Paraplegia  X  Non-gastric-band surgery    Operation Time >/=3 hr    Return to OR     Length of Stay >/= 3 d      Hx of VTE   Hypercoagulable condition   Significant venous stasis   Predicted probability of 30-day post-discharge VTE: 0.16^  Other patient-specific factors to consider:   Recommendation for Discharge: No pharmacologic prophylaxis post-discharge  Melissa Greene is a 33 y.o. 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 Other     NO BLOOD PRODUCTS   . Penicillins Anaphylaxis and Swelling    Did it involve swelling of the face/tongue/throat, SOB, or low BP? Yes Did it involve sudden or severe rash/hives, skin peeling, or any reaction on the inside of your mouth or nose? No Did you need to seek medical attention at a hospital or doctor's office? Yes When did it last happen?childhood allergy If all above answers are "NO", may proceed with cephalosporin use.   Melissa Greene [Cefaclor] Swelling    Patient Measurements: Height: 5' 4.5" (163.8 cm) Weight: 287 lb 8 oz (130.4 kg) IBW/kg (Calculated) : 55.85 Body mass index is 48.59 kg/m.  No results for input(s): WBC, HGB, HCT, PLT, APTT, CREATININE, LABCREA, CREATININE, CREAT24HRUR, MG, PHOS, ALBUMIN, PROT, ALBUMIN, AST, ALT, ALKPHOS, BILITOT, BILIDIR, IBILI in the last 72 hours. Estimated Creatinine Clearance: 116.6 mL/min (by C-G formula based on SCr of 0.92 mg/dL).    Past Medical History:  Diagnosis Date  . Allergy   . Anxiety   . Asthma   . Depression      Medications Prior to Admission  Medication Sig Dispense Refill Last  Dose  . cetirizine (ZYRTEC) 10 MG tablet Take 10 mg by mouth daily as needed for allergies.   Past Week at Unknown time  . diphenhydrAMINE (BENADRYL) 25 MG tablet Take 25 mg by mouth daily as needed for itching or allergies.    Past Week at Unknown time  . fluticasone (FLONASE) 50 MCG/ACT nasal spray Place 1 spray into both nostrils daily as needed for allergies or rhinitis.   Past Week at Unknown time  . risperiDONE (RISPERDAL) 0.5 MG tablet Take 0.5 mg by mouth at bedtime.    Past Week at Unknown time  . traZODone (DESYREL) 50 MG tablet Take 50 mg by mouth at bedtime.   Past Week at Unknown time  . albuterol (VENTOLIN HFA) 108 (90 Base) MCG/ACT inhaler Inhale 2 puffs into the lungs every 6 (six) hours as needed for wheezing or shortness of breath.   More than a month at Unknown time       Melissa Greene 07/09/2019,12:14 PM

## 2019-07-09 NOTE — Discharge Instructions (Signed)
° ° ° °GASTRIC BYPASS/SLEEVE ° Home Care Instructions ° ° These instructions are to help you care for yourself when you go home. ° °Call: If you have any problems. °• Call [phone] and ask for the surgeon on call °• If you need immediate help, come to the ER at [facility].  °• Tell the ER staff that you are a new post-op gastric bypass or gastric sleeve patient °  °Signs and symptoms to report: • Severe vomiting or nausea °o If you cannot keep down clear liquids for longer than 1 day, call your surgeon  °• Abdominal pain that does not get better after taking your pain medication °• Fever over 100.4° F with chills °• Heart beating over 100 beats a minute °• Shortness of breath at rest °• Chest pain °•  Redness, swelling, drainage, or foul odor at incision (surgical) sites °•  If your incisions open or pull apart °• Swelling or pain in calf (lower leg) °• Diarrhea (Loose bowel movements that happen often), frequent watery, uncontrolled bowel movements °• Constipation, (no bowel movements for 3 days) if this happens: Pick one °o Milk of Magnesia, 2 tablespoons by mouth, 3 times a day for 2 days if needed °o Stop taking Milk of Magnesia once you have a bowel movement °o Call your doctor if constipation continues °Or °o Miralax  (instead of Milk of Magnesia) following the label instructions °o Stop taking Miralax once you have a bowel movement °o Call your doctor if constipation continues °• Anything you think is not normal °  °Normal side effects after surgery: • Unable to sleep at night or unable to focus °• Irritability or moody °• Being tearful (crying) or depressed °These are common complaints, possibly related to your anesthesia medications that put you to sleep, stress of surgery, and change in lifestyle.  This usually goes away a few weeks after surgery.  If these feelings continue, call your primary care doctor. °  °Wound Care: You may have surgical glue, steri-strips, or staples over your incisions after  surgery °• Surgical glue:  Looks like a clear film over your incisions and will wear off a little at a time °• Steri-strips: Strips of tape over your incisions. You may notice a yellowish color on the skin under the steri-strips. This is used to make the   steri-strips stick better. Do not pull the steri-strips off - let them fall off °• Staples: Staples may be removed before you leave the hospital °o If you go home with staples, call [facility], [phone] at for an appointment with your surgeon’s nurse to have staples removed 10 days after surgery. °• Showering: You may shower two (2) days after your surgery unless your surgeon tells you differently °o Wash gently around incisions with warm soapy water, rinse well, and gently pat dry  °o No tub baths until staples are removed, steri-strips fall off or glue is gone.  °  °Medications: • Medications should be liquid or crushed if larger than the size of a dime °• Extended release pills (medication that release a little bit at a time through the day) should NOT be crushed or cut. (examples include XL, ER, DR, SR) °• Depending on the size and number of medications you take, you may need to space (take a few throughout the day)/change the time you take your medications so that you do not over-fill your pouch (smaller stomach) °• Make sure you follow-up with your primary care doctor to   make medication changes needed during rapid weight loss and life-style changes °• If you have diabetes, follow up with the doctor that orders your diabetes medication(s) within one week after surgery and check your blood sugar regularly. °• Do not drive while taking prescription pain medication  °• It is ok to take Tylenol by the bottle instructions with your pain medicine or instead of your pain medicine as needed.  DO NOT TAKE NSAIDS (EXAMPLES OF NSAIDS:  IBUPROFREN/ NAPROXEN)  °Diet:                    First 2 Weeks ° You will see the dietician t about two (2) weeks  after your surgery. The dietician will increase the types of foods you can eat if you are handling liquids well: °• If you have severe vomiting or nausea and cannot keep down clear liquids lasting longer than 1 day, call your surgeon @ ([phone]) °Protein Shake °• Drink at least 2 ounces of shake 5-6 times per day °• Each serving of protein shakes (usually 8 - 12 ounces) should have: °o 15 grams of protein  °o And no more than 5 grams of carbohydrate  °• Goal for protein each day: °o Men = 80 grams per day °o Women = 60 grams per day °• Protein powder may be added to fluids such as non-fat milk or Lactaid milk or unsweetened Soy/Almond milk (limit to 35 grams added protein powder per serving) ° °Hydration °• Slowly increase the amount of water and other clear liquids as tolerated (See Acceptable Fluids) °• Slowly increase the amount of protein shake as tolerated  °•  Sip fluids slowly and throughout the day.  Do not use straws. °• May use sugar substitutes in small amounts (no more than 6 - 8 packets per day; i.e. Splenda) ° °Fluid Goal °• The first goal is to drink at least 8 ounces of protein shake/drink per day (or as directed by the nutritionist); some examples of protein shakes are Syntrax Nectar, Adkins Advantage, EAS Edge HP, and Unjury. See handout from pre-op Bariatric Education Class: °o Slowly increase the amount of protein shake you drink as tolerated °o You may find it easier to slowly sip shakes throughout the day °o It is important to get your proteins in first °• Your fluid goal is to drink 64 - 100 ounces of fluid daily °o It may take a few weeks to build up to this °• 32 oz (or more) should be clear liquids  °And  °• 32 oz (or more) should be full liquids (see below for examples) °• Liquids should not contain sugar, caffeine, or carbonation ° °Clear Liquids: °• Water or Sugar-free flavored water (i.e. Fruit H2O, Propel) °• Decaffeinated coffee or tea (sugar-free) °• Crystal Lite, Wyler’s Lite,  Minute Maid Lite °• Sugar-free Jell-O °• Bouillon or broth °• Sugar-free Popsicle:   *Less than 20 calories each; Limit 1 per day ° °Full Liquids: °Protein Shakes/Drinks + 2 choices per day of other full liquids °• Full liquids must be: °o No More Than 15 grams of Carbs per serving  °o No More Than 3 grams of Fat per serving °• Strained low-fat cream soup (except Cream of Potato or Tomato) °• Non-Fat milk °• Fat-free Lactaid Milk °• Unsweetened Soy Or Unsweetened Almond Milk °• Low Sugar yogurt (Dannon Lite & Fit, Greek yogurt; Oikos Triple Zero; Chobani Simply 100; Yoplait 100 calorie Greek - No Fruit on the Bottom) ° °  °Vitamins   and Minerals • Start 1 day after surgery unless otherwise directed by your surgeon °• 2 Chewable Bariatric Specific Multivitamin / Multimineral Supplement with iron (Example: Bariatric Advantage Multi EA) °• Chewable Calcium with Vitamin D-3 °(Example: 3 Chewable Calcium Plus 600 with Vitamin D-3) °o Take 500 mg three (3) times a day for a total of 1500 mg each day °o Do not take all 3 doses of calcium at one time as it may cause constipation, and you can only absorb 500 mg  at a time  °o Do not mix multivitamins containing iron with calcium supplements; take 2 hours apart °• Menstruating women and those with a history of anemia (a blood disease that causes weakness) may need extra iron °o Talk with your doctor to see if you need more iron °• Do not stop taking or change any vitamins or minerals until you talk to your dietitian or surgeon °• Your Dietitian and/or surgeon must approve all vitamin and mineral supplements °  °Activity and Exercise: Limit your physical activity as instructed by your doctor.  It is important to continue walking at home.  During this time, use these guidelines: °• Do not lift anything greater than ten (10) pounds for at least two (2) weeks °• Do not go back to work or drive until your surgeon says you can °• You may have sex when you feel comfortable  °o It is  VERY important for female patients to use a reliable birth control method; fertility often increases after surgery  °o All hormonal birth control will be ineffective for 30 days after surgery due to medications given during surgery a barrier method must be used. °o Do not get pregnant for at least 18 months °• Start exercising as soon as your doctor tells you that you can °o Make sure your doctor approves any physical activity °• Start with a simple walking program °• Walk 5-15 minutes each day, 7 days per week.  °• Slowly increase until you are walking 30-45 minutes per day °Consider joining our BELT program. [phone] or email [email or web address] °  °Special Instructions Things to remember: °• Use your CPAP when sleeping if this applies to you ° °• [facility] has two free Bariatric Surgery Support Groups that meet monthly °o The 3rd Thursday of each month, 6 pm, [facility]  °o The 2nd Friday of each month, 11:45 am in the private dining room in the basement of  °• It is very important to keep all follow up appointments with your surgeon, dietitian, primary care physician, and behavioral health practitioner °• Routine follow up schedule with your surgeon include appointments at 2-3 weeks, 6-8 weeks, 6 months, and 1 year at a minimum.  Your surgeon may request to see you more often.   °o After the first year, please follow up with your bariatric surgeon and dietitian at least once a year in order to maintain best weight loss results °[facility]: [phone] °[facility]: [phone] °Bariatric Nurse Coordinator: [phone] °  °   Reviewed and Endorsed  °by [facility] Patient Education Committee, June, 2016 °Edits Approved: Aug, 2018 ° ° ° °

## 2019-07-09 NOTE — Transfer of Care (Signed)
Immediate Anesthesia Transfer of Care Note  Patient: Melissa Greene  Procedure(s) Performed: LAPAROSCOPIC GASTRIC SLEEVE RESECTION, Upper Endo, ERAS Pathway (N/A Abdomen)  Patient Location: PACU  Anesthesia Type:General  Level of Consciousness: awake, alert  and oriented  Airway & Oxygen Therapy: Patient Spontanous Breathing and Patient connected to face mask oxygen  Post-op Assessment: Report given to RN and Post -op Vital signs reviewed and stable  Post vital signs: Reviewed and stable  Last Vitals:  Vitals Value Taken Time  BP 176/118 07/09/19 0855  Temp    Pulse 84 07/09/19 0857  Resp 17 07/09/19 0857  SpO2 100 % 07/09/19 0857  Vitals shown include unvalidated device data.  Last Pain:  Vitals:   07/09/19 0602  TempSrc: Oral  PainSc:          Complications: No apparent anesthesia complications

## 2019-07-09 NOTE — Op Note (Signed)
Name:  Melissa Greene MRN: [id number] Date of Surgery: 07/09/2019  Preop Diagnosis:  Morbid Obesity  Postop Diagnosis:  Morbid Obesity (Weight - 290, BMI - 49.8), S/P Gastric Sleeve resection  Procedure:  Upper endoscopy  (Intraoperative)  Surgeon:  Alphonsa Overall, M.D.  Anesthesia:  GET  Indications for procedure: Melissa Greene is a 33 y.o. female whose primary care physician is [name], NP and has completed a gastric sleeve resection today for weight loss by Dr. Kae Heller.  I am doing an intraoperative upper endoscopy to evaluate the gastric pouch after the sleeve gastrectomy.  Operative Note: The patient is under general anesthesia.  Dr. Kae Heller is laparoscoping the patient while I do an upper endoscopy to evaluate the stomach pouch.  With the patient intubated, I passed the Olympus upper endoscope without difficulty down the esophagus.  The esophagus was unremarkable.  The esophago-gastric junction was at 38 cm.    The mucosa of the stomach looked viable and the staple line was intact without bleeding.  I advanced the scope to the pylorus, but did not go through it.  While I insufflated the stomach pouch with air, Dr. Kae Heller  flooded the upper abdomen with saline to put the gastric pouch under saline.  There was no bubbling or evidence of a leak.  There was no evidence of narrowing of the pouch and the gastric sleeve looked tubular.  The scope was then withdrawn.  The esophagus was unremarkable and the patient tolerated the endoscopy without difficulty.  Alphonsa Overall, MD, [facility] Pager: [phone] Office phone:  [phone]

## 2019-07-09 NOTE — Op Note (Signed)
Operative Note  GIOVANNA KEMMERER  [id number]  [id number]  07/09/2019   Surgeon: Victorino Sparrow ConnorMD  Assistant: Alphonsa Overall MD  Procedure performed: laparoscopic sleeve gastrectomy, upper endoscopy  Preop diagnosis: Morbid obesity Body mass index is 48.59 kg/m. Post-op diagnosis/intraop findings: same  Specimens: fundus Retained items: none EBL: minimal  Complications: none  Description of procedure: After obtaining informed consent and administration of prophylactic lovenox in holding, the patient was taken to the operating room and placed supine on operating room table wheregeneral endotracheal anesthesia was initiated, preoperative antibiotics were administered, SCDs applied, and a formal timeout was performed. The abdomen was prepped and draped in usual sterile fashion. Peritoneal access was gained using a Visiport technique in the left upper quadrant and insufflation to 15 mmHg ensued without issue. Gross inspection revealed no evidence of injury. Under direct visualization three more 5 mm trochars were placed in the right and left hemiabdomen and the 11mm trocar in the right paramedian upper abdomen. Bilateral laparoscopic assisted TAPS blocks were performed with Exparel diluted with 0.25 percent Marcaine with epinephrine. The patient was placed in steep Trendelenburg and the liver retractor was introduced through an incision in the upper midline and secured to the post externally to maintain the left lobe retracted anteriorly. There was no hiatal hernia. Using the Harmonic scalpel, the greater curvature of the stomach was dissected away from the greater omentum and short gastric vessels were divided. This began 6 cm from the pylorus, and dissection proceeded until the left crus was clearly exposed. Proximally the stomach was intimately adherent to the spleen and this was carefully dissected away. A very small capsular tear on the spleen was made, this was hemostatic on multiple  inspections. A piece of surgicel Emogene Morgan was placed here at the end of the case as well. The 62 [place] VisiGi was then introduced and directed down towards the pylorus. This was placed to suction against the lesser curve. Serial fires of the 4mm linear cutting stapler with seamguards were then employed to create our sleeve. The first fire used a green load and ensured adequate room at the angularis incisura. One gold load and then 3 blue loads were then employed to create a narrow tubular stomach up to the angle of His. The excised stomach was then removed through our 15 mm trocar site within an Endo Catch bag. The visigi was taken off of suction and a few puffs of air were introduced, inflating the sleeve. No bubbles were observed and the irrigation fluid around the stomach and the shape was noted to be a nice smooth tube without any narrowing at the angularis. The visigi was then removed. Upper endoscopy was performed by the assistant surgeon and the sleeve was noted to be airtight, the staple line was hemostatic. Please see his separate note. The endoscope was removed. The 15 mm trocar site fascia in the right upper abdomen was closed with a 0 Vicryl using the laparoscopic suture passer under direct visualization. The liver retractor was removed under direct visualization. The abdomen was then desufflated and all remaining trochars removed. The skin incisions were closed with running subcuticular Monocryl; benzoin, Steri-Strips and Band-Aids were applied The patient was then awakened, extubated and taken to PACU in stable condition.    All counts were correct at the completion of the case.

## 2019-07-09 NOTE — Progress Notes (Signed)
Unable to start waters at 1203 as ordered, patient is very sleepy and has not met requirements to beging water

## 2019-07-09 NOTE — Anesthesia Postprocedure Evaluation (Signed)
Anesthesia Post Note  Patient: Melissa Greene  Procedure(s) Performed: LAPAROSCOPIC GASTRIC SLEEVE RESECTION, Upper Endo, ERAS Pathway (N/A Abdomen)     Patient location during evaluation: PACU Anesthesia Type: General Level of consciousness: awake and sedated Pain management: pain level controlled Vital Signs Assessment: post-procedure vital signs reviewed and stable Respiratory status: spontaneous breathing Cardiovascular status: stable Anesthetic complications: yes Anesthetic complication details: PONV   Last Vitals:  Vitals:   07/09/19 0945 07/09/19 1000  BP: (!) 163/101 (!) 167/104  Pulse: (!) 57 (!) 56  Resp: 13 14  Temp:    SpO2: 100% 100%    Last Pain:  Vitals:   07/09/19 1015  TempSrc:   PainSc: Asleep   Pain Goal:                   Huston Foley

## 2019-07-09 NOTE — Interval H&P Note (Signed)
History and Physical Interval Note:  07/09/2019 6:54 AM  Melissa Greene  has presented today for surgery, with the diagnosis of Morbid Obesity,.  The various methods of treatment have been discussed with the patient and family. After consideration of risks, benefits and other options for treatment, the patient has consented to  Procedure(s): LAPAROSCOPIC GASTRIC SLEEVE RESECTION, Upper Endo, ERAS Pathway (N/A) as a surgical intervention.  The patient's history has been reviewed, patient examined, no change in status, stable for surgery.  I have reviewed the patient's chart and labs.  Questions were answered to the patient's satisfaction.     Dilon Lank Rich Brave

## 2019-07-09 NOTE — Progress Notes (Signed)
Discussed post op day goals with patient nurse including ambulation, IS, diet progression, pain, and nausea control.   Patient drowsy but awakens easily post bariatric procedure.  BSTOP information guide, "Guide for Pain Management after your Bariatric Procedure" along with Phase I diet plan at bedside for review with patient when more awake. Will continue to monitor

## 2019-07-10 ENCOUNTER — Encounter (HOSPITAL_COMMUNITY): Payer: Self-pay | Admitting: Surgery

## 2019-07-10 LAB — CBC WITH DIFFERENTIAL/PLATELET
Abs Immature Granulocytes: 0.04 10*3/uL (ref 0.00–0.07)
Basophils Absolute: 0 10*3/uL (ref 0.0–0.1)
Basophils Relative: 0 %
Eosinophils Absolute: 0 10*3/uL (ref 0.0–0.5)
Eosinophils Relative: 0 %
HCT: 39.9 % (ref 36.0–46.0)
Hemoglobin: 12.2 g/dL (ref 12.0–15.0)
Immature Granulocytes: 0 %
Lymphocytes Relative: 19 %
Lymphs Abs: 2 10*3/uL (ref 0.7–4.0)
MCH: 24.7 pg — ABNORMAL LOW (ref 26.0–34.0)
MCHC: 30.6 g/dL (ref 30.0–36.0)
MCV: 80.9 fL (ref 80.0–100.0)
Monocytes Absolute: 1.1 10*3/uL — ABNORMAL HIGH (ref 0.1–1.0)
Monocytes Relative: 10 %
Neutro Abs: 7.6 10*3/uL (ref 1.7–7.7)
Neutrophils Relative %: 71 %
Platelets: 296 10*3/uL (ref 150–400)
RBC: 4.93 MIL/uL (ref 3.87–5.11)
RDW: 14.8 % (ref 11.5–15.5)
WBC: 10.7 10*3/uL — ABNORMAL HIGH (ref 4.0–10.5)
nRBC: 0 % (ref 0.0–0.2)

## 2019-07-10 LAB — COMPREHENSIVE METABOLIC PANEL
ALT: 28 U/L (ref 0–44)
AST: 19 U/L (ref 15–41)
Albumin: 3 g/dL — ABNORMAL LOW (ref 3.5–5.0)
Alkaline Phosphatase: 60 U/L (ref 38–126)
Anion gap: 9 (ref 5–15)
BUN: 12 mg/dL (ref 6–20)
CO2: 22 mmol/L (ref 22–32)
Calcium: 8.2 mg/dL — ABNORMAL LOW (ref 8.9–10.3)
Chloride: 106 mmol/L (ref 98–111)
Creatinine, Ser: 0.79 mg/dL (ref 0.44–1.00)
GFR calc Af Amer: >60 mL/min (ref >60)
GFR calc non Af Amer: >60 mL/min (ref >60)
Glucose, Bld: 103 mg/dL — ABNORMAL HIGH (ref 70–99)
Potassium: 4.2 mmol/L (ref 3.5–5.1)
Sodium: 137 mmol/L (ref 135–145)
Total Bilirubin: 0.5 mg/dL (ref 0.3–1.2)
Total Protein: 6 g/dL — ABNORMAL LOW (ref 6.5–8.1)

## 2019-07-10 MED ORDER — GABAPENTIN 300 MG PO CAPS
300.0000 mg | ORAL_CAPSULE | Freq: Three times a day (TID) | ORAL | Status: DC
  Administered 2019-07-10 – 2019-07-11 (×3): 300 mg via ORAL
  Filled 2019-07-10 (×3): qty 1

## 2019-07-10 MED ORDER — METOCLOPRAMIDE HCL 5 MG/ML IJ SOLN
10.0000 mg | Freq: Four times a day (QID) | INTRAMUSCULAR | Status: DC
  Administered 2019-07-10 – 2019-07-11 (×4): 10 mg via INTRAVENOUS
  Filled 2019-07-10 (×4): qty 2

## 2019-07-10 NOTE — Progress Notes (Signed)
NUTRITION NOTE RD working remotely.  Consult received for bariatric diet teaching. No RD available on site today and post-op education, including diet education, will be provided by the Bariatric Nurse Coordinator.   Please re-consult RD if further nutrition-related needs arise.   Jarome Matin, MS, RD, LDN, [facility] Inpatient Clinical Dietitian Pager # [phone] After hours/weekend pager # [phone]

## 2019-07-10 NOTE — Progress Notes (Signed)
S: Slept most of the day after receiving phenergan and dilaudid in PACU. Still tired. Walked last evening and today. Clears and protein shakes going down ok but experiencing pain, nausea, and hiccups. Feeling some gas pain. No emesis. Has already started protein shakes this AM.  -Did not receive this AM's dose of tylenol as she did not like the liquid. Receiving scheduled gabapentin. -Has received toradol x 1, tramadol x 1, zofran x 1 since leaving PACU. Has not rec'd any PRN meds overnight.   Vitals, labs, intake/output, and orders reviewed at this time. Afebrile, NO tachycardia, normo- to mildly hypertensive. Sat 100% on RA this morning. PO 240, UOP 2050.CMP unremarkable. WBC 10.7 (7.6 preop). Hgb 12.2 (14.4 preop), PLT 296 (363)  Gen: A&Ox3, no distress  H&N: EOMI, atraumatic, neck supple Chest: unlabored respirations, RRR Abd: soft, appropriately tender in upper abdomen, nondistended, incisions c/d/i with steris/ bandaids; no cellulitis or hematoma Ext: warm, no edema Neuro: grossly normal  Lines/tubes/drains: PIV  A/P:  POD 1 sleeve gastrectomy.  -Continue clears/ protein shakes -Continue pulmonary toilet, ambulation, SCDs/ lvx -Advised pt that Tylenol tablets are on her med list and encouraged her to take that, also try robaxin; having some pain this AM but has not received any meds since around 9:40pm last night   Possible DC this afternoon if PO intake sufficient and pain control improved   Try robaxin Romana Juniper, MD [facility], [place] Pager [phone]

## 2019-07-10 NOTE — Progress Notes (Signed)
She is feeling a little better this afternoon, but still some pain and PO intake has been slow- only about 4oz protein and a small amt of water today. Walking the halls.  Remains afebrile, no tachycardia. Sat 100% RA, mildly hypertensive.   She does not meet criteria for discharge. She will need to remain inpatient for tonight to ensure adequate oral intake to remain hydrated at home. Will schedule reglan. Stop dilaudid and oxycodone. Continue sch tylenol, increase sch gabapentin, continue PRN toradol, robaxin, and tramadol.

## 2019-07-10 NOTE — Progress Notes (Signed)
Patient alert and oriented, Post op day 1.  Provided support and encouragement.  Encouraged pulmonary toilet, ambulation and small sips of liquids. Completed 12 ounces of bari clear fluid and 2 ounces protein shake. All questions answered.  Will continue to monitor.

## 2019-07-11 MED ORDER — ONDANSETRON 4 MG PO TBDP: 4.0000 mg | ORAL_TABLET | Freq: Four times a day (QID) | ORAL | 0 refills | Status: DC | PRN

## 2019-07-11 MED ORDER — PANTOPRAZOLE SODIUM 40 MG PO TBEC: 40.0000 mg | DELAYED_RELEASE_TABLET | Freq: Every day | ORAL | 0 refills | Status: DC

## 2019-07-11 MED ORDER — GABAPENTIN 300 MG PO CAPS: 300.0000 mg | ORAL_CAPSULE | Freq: Three times a day (TID) | ORAL | 0 refills | Status: DC

## 2019-07-11 MED ORDER — DOCUSATE SODIUM 100 MG PO CAPS: 100.0000 mg | ORAL_CAPSULE | Freq: Two times a day (BID) | ORAL | 0 refills | Status: DC

## 2019-07-11 MED ORDER — TRAMADOL HCL 50 MG PO TABS: 50.0000 mg | ORAL_TABLET | Freq: Four times a day (QID) | ORAL | 0 refills | Status: DC | PRN

## 2019-07-11 MED ORDER — ACETAMINOPHEN 500 MG PO TABS: 1000.0000 mg | ORAL_TABLET | Freq: Three times a day (TID) | ORAL | 0 refills | Status: DC

## 2019-07-11 NOTE — Discharge Summary (Signed)
Physician Discharge Summary  Melissa Greene PYP:[id number] DOB: 07-27-1986 DOA: 07/09/2019  PCP: [name], NP  Admit date: 07/09/2019 Discharge date: 07/11/2019  Recommendations for Outpatient Follow-up:   Follow-up Information    [name], MD. Go on 07/27/2019.   Specialty: General Surgery Why: at 420pm Contact information: [address] [place] [address] [phone]        [facility], [name]. Go on 09/06/2019.   Specialty: General Surgery Why: at 920 am Contact information: [name] [address] [phone]          Discharge Diagnoses:  Active Problems:   Morbid obesity ([place])   Surgical Procedure: Laparoscopic Sleeve Gastrectomy, upper endoscopy  Discharge Condition: Good Disposition: Home  Diet recommendation: Postoperative sleeve gastrectomy diet (liquids only)  Filed Weights   07/09/19 0602  Weight: 130.4 kg     Hospital Course:  The patient was admitted for a planned laparoscopic sleeve gastrectomy. Please see operative note. Preoperatively the patient was given lovenox for DVT prophylaxis. Postoperative prophylactic Lovenox dosing was started on the evening of postoperative day 0. ERAS protocol was used. On the evening of postoperative day 0, the patient was started on water and ice chips. On postoperative day 1 the patient had no fever or tachycardia and was tolerating water in their diet was gradually advanced throughout the day. The patient was ambulating without difficulty. Their vital signs are stable without fever or tachycardia. Their hemoglobin had remained stable. Her PO intake was slow and she was kept one more night to ensure improvement, which was accomplished by the morning of post-op day 2. The patient had received discharge instructions and counseling. They were deemed stable for discharge and had met discharge criteria   Discharge Instructions  Discharge Instructions     Ambulate hourly while awake   Complete by: As directed    Call MD for:  difficulty breathing, headache or visual disturbances   Complete by: As directed    Call MD for:  persistant dizziness or light-headedness   Complete by: As directed    Call MD for:  persistant nausea and vomiting   Complete by: As directed    Call MD for:  redness, tenderness, or signs of infection (pain, swelling, redness, odor or green/yellow discharge around incision site)   Complete by: As directed    Call MD for:  severe uncontrolled pain   Complete by: As directed    Call MD for:  temperature >101 F   Complete by: As directed    Diet bariatric full liquid   Complete by: As directed    Incentive spirometry   Complete by: As directed    Perform hourly while awake     Allergies as of 07/11/2019      Reactions   Other    NO BLOOD PRODUCTS    Penicillins Anaphylaxis, Swelling   Did it involve swelling of the face/tongue/throat, SOB, or low BP? Yes Did it involve sudden or severe rash/hives, skin peeling, or any reaction on the inside of your mouth or nose? No Did you need to seek medical attention at a hospital or doctor's office? Yes When did it last happen?childhood allergy If all above answers are "NO", may proceed with cephalosporin use.   Ceclor [cefaclor] Swelling      Medication List    TAKE these medications   acetaminophen 500 MG tablet Commonly known as: TYLENOL Take 2 tablets (1,000 mg total) by mouth  every 8 (eight) hours. Notes to patient: Take this scheduled for the next few days, then transition to taking as needed for pain   albuterol 108 (90 Base) MCG/ACT inhaler Commonly known as: VENTOLIN HFA Inhale 2 puffs into the lungs every 6 (six) hours as needed for wheezing or shortness of breath.   cetirizine 10 MG tablet Commonly known as: ZYRTEC Take 10 mg by mouth daily as needed for allergies.   diphenhydrAMINE 25 MG tablet Commonly known as: BENADRYL Take 25 mg by mouth  daily as needed for itching or allergies.   docusate sodium 100 MG capsule Commonly known as: COLACE Take 1 capsule (100 mg total) by mouth 2 (two) times daily. Notes to patient: Ok to decrease to once daily or stop taking if having loose bowel movements.   fluticasone 50 MCG/ACT nasal spray Commonly known as: FLONASE Place 1 spray into both nostrils daily as needed for allergies or rhinitis.   gabapentin 300 MG capsule Commonly known as: NEURONTIN Take 1 capsule (300 mg total) by mouth every 8 (eight) hours.   ondansetron 4 MG disintegrating tablet Commonly known as: ZOFRAN-ODT Take 1 tablet (4 mg total) by mouth every 6 (six) hours as needed for nausea or vomiting.   pantoprazole 40 MG tablet Commonly known as: PROTONIX Take 1 tablet (40 mg total) by mouth daily. Notes to patient: Take this every day, regardless of whether you are having reflux symptoms.   risperiDONE 0.5 MG tablet Commonly known as: RISPERDAL Take 0.5 mg by mouth at bedtime.   traMADol 50 MG tablet Commonly known as: ULTRAM Take 1 tablet (50 mg total) by mouth every 6 (six) hours as needed for moderate pain or severe pain.   traZODone 50 MG tablet Commonly known as: DESYREL Take 50 mg by mouth at bedtime.      Follow-up Information    [name], MD. Go on 07/27/2019.   Specialty: General Surgery Why: at 420pm Contact information: [address] [place] [address] [phone]        Surgery, [name]. Go on 09/06/2019.   Specialty: General Surgery Why: at 920 am Contact information: [name] [address] [phone]            The results of significant diagnostics from this hospitalization (including imaging, microbiology, ancillary and laboratory) are listed below for reference.    Significant Diagnostic Studies: No results found.  Labs: Basic Metabolic Panel: Recent Labs  Lab 07/05/19 1308 07/10/19 0334  NA  138 137  K 4.4 4.2  CL 104 106  CO2 25 22  GLUCOSE 96 103*  BUN 19 12  CREATININE 0.92 0.79  CALCIUM 9.3 8.2*   Liver Function Tests: Recent Labs  Lab 07/05/19 1308 07/10/19 0334  AST 21 19  ALT 33 28  ALKPHOS 80 60  BILITOT 0.4 0.5  PROT 8.1 6.0*  ALBUMIN 3.9 3.0*    CBC: Recent Labs  Lab 07/05/19 1308 07/10/19 0334  WBC 7.6 10.7*  NEUTROABS 3.8 7.6  HGB 14.4 12.2  HCT 47.9* 39.9  MCV 80.8 80.9  PLT 363 296    CBG: No results for input(s): GLUCAP in the last 168 hours.  Active Problems:   Morbid obesity ([place])   Signed:  [name], [facility], [place] 07/11/2019, 1:58 PM

## 2019-07-11 NOTE — Progress Notes (Signed)
Patient alert and oriented, pain is controlled. Patient is tolerating fluids, advanced to protein shake today, patient is tolerating well.  Reviewed Gastric sleeve discharge instructions with patient and patient is able to articulate understanding.  Provided information on BELT program, Support Group and [facility] outpatient pharmacy. All questions answered, will continue to monitor.  Total fluid intake 630 Call back Friday 07/13/19 per hydration protocol

## 2019-07-11 NOTE — Plan of Care (Signed)
All goals met for d/c 

## 2019-07-11 NOTE — Progress Notes (Signed)
Pt given d/c instructions by Arrie Aran, RN.  Drank all protein as instructed. IV removed and pt d/cd to home.

## 2019-07-11 NOTE — Plan of Care (Signed)
All goals met for d.c

## 2019-07-13 ENCOUNTER — Telehealth (HOSPITAL_COMMUNITY): Payer: Self-pay

## 2019-07-13 NOTE — Telephone Encounter (Signed)
Patient called to discuss post bariatric surgery follow up questions.  See below:   1.  Tell me about your pain and pain management?taking pain medication helping  2.  Let's talk about fluid intake.  How much total fluid are you taking in?52 ounces   3.  How much protein have you taken in the last 2 days?60  4.  Have you had nausea?  Tell me about when have experienced nausea and what you did to help?denies  5.  Has the frequency or color changed with your urine?making urine  6.  Tell me what your incisions look like?incision  7.  Have you been passing gas? BM?yes  Passing gas  8.  If a problem or question were to arise who would you call?  Do you know contact numbers for [place], [facility], and [facility] contact  9.  How has the walking going?walking around  10.  How are your vitamins and calcium going?  How are you taking them?does not want to take bariatric vitamin, provided contact information for dietitian to help assess for proper supplements with current mvi

## 2019-07-24 ENCOUNTER — Encounter: Payer: BC Managed Care – PPO | Attending: Surgery | Admitting: Skilled Nursing Facility1

## 2019-07-24 ENCOUNTER — Other Ambulatory Visit: Payer: Self-pay

## 2019-07-24 NOTE — Progress Notes (Signed)
2 Week Post-Operative Nutrition Class   Patient was seen on 01/16/19 for Post-Operative Nutrition education at the [facility].    Pt states she has been feeling very tired.  Surgery date: 07/09/2019 Surgery type: sleeve Start weight at [facility]: 284.8 Weight today: 267.2    Body Composition Scale 07/24/2019  Total Body Fat % 46  Visceral Fat 15  Fat-Free Mass % 53.9   Total Body Water % 41.4   Muscle-Mass lbs 33  Body Fat Displacement          Torso  lbs 76.3         Left Leg  lbs 15.2         Right Leg  lbs 15.2         Left Arm  lbs 7.6         Right Arm   lbs 7.6     The following the learning objectives were met by the patient during this course:  Identifies Phase 3 (Soft, High Proteins) Dietary Goals and will begin from 2 weeks post-operatively to 2 months post-operatively  Identifies appropriate sources of fluids and proteins   States protein recommendations and appropriate sources post-operatively  Identifies the need for appropriate texture modifications, mastication, and bite sizes when consuming solids  Identifies appropriate multivitamin and calcium sources post-operatively  Describes the need for physical activity post-operatively and will follow MD recommendations  States when to call healthcare provider regarding medication questions or post-operative complications   Handouts given during class include:  Phase 3A: Soft, High Protein Diet Handout   Follow-Up Plan: Patient will follow-up at [facility] in 6 weeks for 2 month post-op nutrition visit for diet advancement per MD.

## 2019-07-31 ENCOUNTER — Telehealth: Payer: Self-pay | Admitting: Skilled Nursing Facility1

## 2019-07-31 NOTE — Telephone Encounter (Signed)
RD called pt to verify fluid intake once starting soft, solid proteins 2 week post-bariatric surgery.   Daily Fluid intake: 64 Daily Protein intake: 60+  Concerns/issues:   None stated  Has been eating green beans

## 2019-08-13 ENCOUNTER — Encounter: Payer: Self-pay | Admitting: Gynecology

## 2019-09-03 ENCOUNTER — Other Ambulatory Visit: Payer: Self-pay

## 2019-09-03 ENCOUNTER — Encounter: Payer: Self-pay | Admitting: Women's Health

## 2019-09-03 ENCOUNTER — Ambulatory Visit: Payer: BC Managed Care – PPO | Admitting: Women's Health

## 2019-09-03 VITALS — BP 118/80

## 2019-09-03 DIAGNOSIS — N760 Acute vaginitis: Secondary | ICD-10-CM

## 2019-09-03 DIAGNOSIS — N898 Other specified noninflammatory disorders of vagina: Secondary | ICD-10-CM

## 2019-09-03 DIAGNOSIS — B9689 Other specified bacterial agents as the cause of diseases classified elsewhere: Secondary | ICD-10-CM | POA: Diagnosis not present

## 2019-09-03 LAB — WET PREP FOR TRICH, YEAST, CLUE

## 2019-09-03 MED ORDER — METRONIDAZOLE 0.75 % VA GEL: VAGINAL | 0 refills | Status: DC

## 2019-09-03 NOTE — Patient Instructions (Signed)
Bacterial Vaginosis  Bacterial vaginosis is a vaginal infection that occurs when the normal balance of bacteria in the vagina is disrupted. It results from an overgrowth of certain bacteria. This is the most common vaginal infection among women ages 15-44. Because bacterial vaginosis increases your risk for STIs (sexually transmitted infections), getting treated can help reduce your risk for chlamydia, gonorrhea, herpes, and HIV (human immunodeficiency virus). Treatment is also important for preventing complications in pregnant women, because this condition can cause an early (premature) delivery. What are the causes? This condition is caused by an increase in harmful bacteria that are normally present in small amounts in the vagina. However, the reason that the condition develops is not fully understood. What increases the risk? The following factors may make you more likely to develop this condition:  Having a new sexual partner or multiple sexual partners.  Having unprotected sex.  Douching.  Having an intrauterine device (IUD).  Smoking.  Drug and alcohol abuse.  Taking certain antibiotic medicines.  Being pregnant. You cannot get bacterial vaginosis from toilet seats, bedding, swimming pools, or contact with objects around you. What are the signs or symptoms? Symptoms of this condition include:  Grey or white vaginal discharge. The discharge can also be watery or foamy.  A fish-like odor with discharge, especially after sexual intercourse or during menstruation.  Itching in and around the vagina.  Burning or pain with urination. Some women with bacterial vaginosis have no signs or symptoms. How is this diagnosed? This condition is diagnosed based on:  Your medical history.  A physical exam of the vagina.  Testing a sample of vaginal fluid under a microscope to look for a large amount of bad bacteria or abnormal cells. Your health care provider may use a cotton swab or  a small wooden spatula to collect the sample. How is this treated? This condition is treated with antibiotics. These may be given as a pill, a vaginal cream, or a medicine that is put into the vagina (suppository). If the condition comes back after treatment, a second round of antibiotics may be needed. Follow these instructions at home: Medicines  Take over-the-counter and prescription medicines only as told by your health care provider.  Take or use your antibiotic as told by your health care provider. Do not stop taking or using the antibiotic even if you start to feel better. General instructions  If you have a female sexual partner, tell her that you have a vaginal infection. She should see her health care provider and be treated if she has symptoms. If you have a female sexual partner, he does not need treatment.  During treatment: ? Avoid sexual activity until you finish treatment. ? Do not douche. ? Avoid alcohol as directed by your health care provider. ? Avoid breastfeeding as directed by your health care provider.  Drink enough water and fluids to keep your urine clear or pale yellow.  Keep the area around your vagina and rectum clean. ? Wash the area daily with warm water. ? Wipe yourself from front to back after using the toilet.  Keep all follow-up visits as told by your health care provider. This is important. How is this prevented?  Do not douche.  Wash the outside of your vagina with warm water only.  Use protection when having sex. This includes latex condoms and dental dams.  Limit how many sexual partners you have. To help prevent bacterial vaginosis, it is best to have sex with just one partner (  monogamous).  Make sure you and your sexual partner are tested for STIs.  Wear cotton or cotton-lined underwear.  Avoid wearing tight pants and pantyhose, especially during summer.  Limit the amount of alcohol that you drink.  Do not use any products that contain  nicotine or tobacco, such as cigarettes and e-cigarettes. If you need help quitting, ask your health care provider.  Do not use illegal drugs. Where to find more information  Centers for Disease Control and Prevention: www.cdc.gov/std  American Sexual Health Association (ASHA): www.ashastd.org  U.S. Department of Health and Human Services, Office on Women's Health: www.womenshealth.gov/ or https://www.womenshealth.gov/a-z-topics/bacterial-vaginosis Contact a health care provider if:  Your symptoms do not improve, even after treatment.  You have more discharge or pain when urinating.  You have a fever.  You have pain in your abdomen.  You have pain during sex.  You have vaginal bleeding between periods. Summary  Bacterial vaginosis is a vaginal infection that occurs when the normal balance of bacteria in the vagina is disrupted.  Because bacterial vaginosis increases your risk for STIs (sexually transmitted infections), getting treated can help reduce your risk for chlamydia, gonorrhea, herpes, and HIV (human immunodeficiency virus). Treatment is also important for preventing complications in pregnant women, because the condition can cause an early (premature) delivery.  This condition is treated with antibiotic medicines. These may be given as a pill, a vaginal cream, or a medicine that is put into the vagina (suppository). This information is not intended to replace advice given to you by your health care provider. Make sure you discuss any questions you have with your health care provider. Document Released: 11/08/2005 Document Revised: 10/21/2017 Document Reviewed: 07/24/2016 Elsevier Patient Education  2020 Elsevier Inc.  

## 2019-09-03 NOTE — Progress Notes (Signed)
32 yo SBF G0 presents with complaint of watery vaginal discharge with odor and mild itching for the past week.  Negative STD screen with partner 05/2019.  Monthly cycles/condoms consistently.  Denies urinary symptoms frequency, burning or pain at end of urination, abdominal/back pain or fever.  Had gastric sleeve 07/09/2019 weight is down 35 pounds but states is struggling with consistently making good choices.  Is seeing a nutritionist.  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  Exam: Appears well, obese, external genitalia erythematous on introitus, speculum exam moderate amount of the adherent white discharge, vaginal wall slightly erythematous, wet prep negative.  Bimanual no CMT or adnexal tenderness with exam.  Clinical BV  Plan: MetroGel vaginal cream 1 applicator at bedtime x5, alcohol precautions reviewed.  Instructed to call if discharge not relieved.  Aware of importance of making good food choices, daily walking, encouraged to continue seeing nutritionist.  Congratulated on weight loss.

## 2019-09-05 ENCOUNTER — Ambulatory Visit: Payer: BC Managed Care – PPO | Admitting: Dietician

## 2019-11-21 ENCOUNTER — Telehealth: Payer: Self-pay

## 2019-11-21 MED ORDER — FLUCONAZOLE 150 MG PO TABS: 150.0000 mg | ORAL_TABLET | Freq: Once | ORAL | 0 refills | Status: AC

## 2019-11-21 NOTE — Telephone Encounter (Signed)
Okay for Diflucan 150 mg 1 tablet office visit if no relief 

## 2019-11-21 NOTE — Telephone Encounter (Signed)
Patient called asking if you will send Rx in for yeast infection. She said she knows she can get something OTC but would like to use her insurance to get Rx if possible.

## 2019-11-21 NOTE — Telephone Encounter (Signed)
Patient advised Rx sent but OV is sx persist.

## 2020-01-11 ENCOUNTER — Other Ambulatory Visit: Payer: Self-pay

## 2020-01-14 ENCOUNTER — Encounter: Payer: Self-pay | Admitting: Women's Health

## 2020-01-14 ENCOUNTER — Ambulatory Visit (INDEPENDENT_AMBULATORY_CARE_PROVIDER_SITE_OTHER): Payer: BC Managed Care – PPO | Admitting: Women's Health

## 2020-01-14 ENCOUNTER — Other Ambulatory Visit: Payer: Self-pay

## 2020-01-14 VITALS — BP 120/78 | Ht 64.0 in | Wt 264.0 lb

## 2020-01-14 DIAGNOSIS — B373 Candidiasis of vulva and vagina: Secondary | ICD-10-CM | POA: Diagnosis not present

## 2020-01-14 DIAGNOSIS — F329 Major depressive disorder, single episode, unspecified: Secondary | ICD-10-CM

## 2020-01-14 DIAGNOSIS — Z9884 Bariatric surgery status: Secondary | ICD-10-CM | POA: Diagnosis not present

## 2020-01-14 DIAGNOSIS — Z113 Encounter for screening for infections with a predominantly sexual mode of transmission: Secondary | ICD-10-CM

## 2020-01-14 DIAGNOSIS — Z01419 Encounter for gynecological examination (general) (routine) without abnormal findings: Secondary | ICD-10-CM | POA: Diagnosis not present

## 2020-01-14 DIAGNOSIS — F419 Anxiety disorder, unspecified: Secondary | ICD-10-CM

## 2020-01-14 DIAGNOSIS — N898 Other specified noninflammatory disorders of vagina: Secondary | ICD-10-CM | POA: Diagnosis not present

## 2020-01-14 LAB — WET PREP FOR TRICH, YEAST, CLUE

## 2020-01-14 MED ORDER — FLUCONAZOLE 150 MG PO TABS: 150.0000 mg | ORAL_TABLET | Freq: Once | ORAL | 1 refills | Status: AC

## 2020-01-14 NOTE — Patient Instructions (Signed)
Call psychiatrist Vit D 1000 iu daily Health Maintenance, Female Adopting a healthy lifestyle and getting preventive care are important in promoting health and wellness. Ask your health care provider about:  The right schedule for you to have regular tests and exams.  Things you can do on your own to prevent diseases and keep yourself healthy. What should I know about diet, weight, and exercise? Eat a healthy diet   Eat a diet that includes plenty of vegetables, fruits, low-fat dairy products, and lean protein.  Do not eat a lot of foods that are high in solid fats, added sugars, or sodium. Maintain a healthy weight Body mass index (BMI) is used to identify weight problems. It estimates body fat based on height and weight. Your health care provider can help determine your BMI and help you achieve or maintain a healthy weight. Get regular exercise Get regular exercise. This is one of the most important things you can do for your health. Most adults should:  Exercise for at least 150 minutes each week. The exercise should increase your heart rate and make you sweat (moderate-intensity exercise).  Do strengthening exercises at least twice a week. This is in addition to the moderate-intensity exercise.  Spend less time sitting. Even light physical activity can be beneficial. Watch cholesterol and blood lipids Have your blood tested for lipids and cholesterol at 35 years of age, then have this test every 5 years. Have your cholesterol levels checked more often if:  Your lipid or cholesterol levels are high.  You are older than 35 years of age.  You are at high risk for heart disease. What should I know about cancer screening? Depending on your health history and family history, you may need to have cancer screening at various ages. This may include screening for:  Breast cancer.  Cervical cancer.  Colorectal cancer.  Skin cancer.  Lung cancer. What should I know about heart  disease, diabetes, and high blood pressure? Blood pressure and heart disease  High blood pressure causes heart disease and increases the risk of stroke. This is more likely to develop in people who have high blood pressure readings, are of African descent, or are overweight.  Have your blood pressure checked: ? Every 3-5 years if you are 35-46 years of age. ? Every year if you are 35 years old or older. Diabetes Have regular diabetes screenings. This checks your fasting blood sugar level. Have the screening done:  Once every three years after age 35 if you are at a normal weight and have a low risk for diabetes.  More often and at a younger age if you are overweight or have a high risk for diabetes. What should I know about preventing infection? Hepatitis B If you have a higher risk for hepatitis B, you should be screened for this virus. Talk with your health care provider to find out if you are at risk for hepatitis B infection. Hepatitis C Testing is recommended for:  Everyone born from [birth year] through 1966.  Anyone with known risk factors for hepatitis C. Sexually transmitted infections (STIs)  Get screened for STIs, including gonorrhea and chlamydia, if: ? You are sexually active and are younger than 35 years of age. ? You are older than 35 years of age and your health care provider tells you that you are at risk for this type of infection. ? Your sexual activity has changed since you were last screened, and you are at increased risk for chlamydia or  gonorrhea. Ask your health care provider if you are at risk.  Ask your health care provider about whether you are at high risk for HIV. Your health care provider may recommend a prescription medicine to help prevent HIV infection. If you choose to take medicine to prevent HIV, you should first get tested for HIV. You should then be tested every 3 months for as long as you are taking the medicine. Pregnancy  If you are about to stop  having your period (premenopausal) and you may become pregnant, seek counseling before you get pregnant.  Take 400 to 800 micrograms (mcg) of folic acid every day if you become pregnant.  Ask for birth control (contraception) if you want to prevent pregnancy. Osteoporosis and menopause Osteoporosis is a disease in which the bones lose minerals and strength with aging. This can result in bone fractures. If you are 28 years old or older, or if you are at risk for osteoporosis and fractures, ask your health care provider if you should:  Be screened for bone loss.  Take a calcium or vitamin D supplement to lower your risk of fractures.  Be given hormone replacement therapy (HRT) to treat symptoms of menopause. Follow these instructions at home: Lifestyle  Do not use any products that contain nicotine or tobacco, such as cigarettes, e-cigarettes, and chewing tobacco. If you need help quitting, ask your health care provider.  Do not use street drugs.  Do not share needles.  Ask your health care provider for help if you need support or information about quitting drugs. Alcohol use  Do not drink alcohol if: ? Your health care provider tells you not to drink. ? You are pregnant, may be pregnant, or are planning to become pregnant.  If you drink alcohol: ? Limit how much you use to 0-1 drink a day. ? Limit intake if you are breastfeeding.  Be aware of how much alcohol is in your drink. In the [place] one drink equals one 12 oz bottle of beer (355 mL), one 5 oz glass of wine (148 mL), or one 1 oz glass of hard liquor (44 mL). General instructions  Schedule regular health, dental, and eye exams.  Stay current with your vaccines.  Tell your health care provider if: ? You often feel depressed. ? You have ever been abused or do not feel safe at home. Summary  Adopting a healthy lifestyle and getting preventive care are important in promoting health and wellness.  Follow your health  care provider's instructions about healthy diet, exercising, and getting tested or screened for diseases.  Follow your health care provider's instructions on monitoring your cholesterol and blood pressure. This information is not intended to replace advice given to you by your health care provider. Make sure you discuss any questions you have with your health care provider. Document Revised: 11/01/2018 Document Reviewed: 11/01/2018 Elsevier Patient Education  [place]. chiatrist

## 2020-01-14 NOTE — Progress Notes (Signed)
[id number]

## 2020-01-14 NOTE — Progress Notes (Signed)
Melissa Greene [birth year] [id number]    History:    Presents for annual exam.  Monthly cycle/condoms.  06/2019 gastric sleeve weight is down 60 pounds.  States has not been trying as hard lately due to depression, beloved dog lady died several months ago but states has not been able to get over it.  Has also stopped her antidepressants.  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  Past medical history, past surgical history, family history and social history were all reviewed and documented in the EPIC chart.  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  Father deceased from diabetes, hypertension.  Originally from [place].  ROS:  A ROS was performed and pertinent positives and negatives are included.  Exam:  Vitals:   01/14/20 1600  BP: 120/78  Weight: 264 lb (119.7 kg)  Height: 5\' 4"  (1.626 m)   Body mass index is 45.32 kg/m.   General appearance:  Normal Thyroid:  Symmetrical, normal in size, without palpable masses or nodularity. Respiratory  Auscultation:  Clear without wheezing or rhonchi Cardiovascular  Auscultation:  Regular rate, without rubs, murmurs or gallops  Edema/varicosities:  Not grossly evident Abdominal  Soft,nontender, without masses, guarding or rebound.  Liver/spleen:  No organomegaly noted  Hernia:  None appreciated  Skin  Inspection:  Grossly normal/history of abdominoplasty, both inner arm reduction   Breasts: Examined lying and sitting.     Right: Without masses, retractions, discharge or axillary adenopathy.     Left: Without masses, retractions, discharge or axillary adenopathy. Gentitourinary   Inguinal/mons:  Normal without inguinal adenopathy  External genitalia:  Normal  BUS/Urethra/Skene's glands:  Normal  Vagina:  Normal  Cervix:  Normal  Uterus:  normal in size, shape and contour.  Midline and mobile  Adnexa/parametria:     Rt: Without masses or tenderness.   Lt: Without masses or tenderness.  Anus and  perineum: Normal    Assessment/Plan:  34 y.o. S BF G0 for annual exam with complaint of vaginal discharge with itching.  Denies urinary symptoms, abdominal pain.  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 x1 dose repeat in 3 days if needed.  Yeast prevention discussed.  SBEs, exercise, calcium rich foods, aware of importance of decreasing calories, avoiding alcohol.  Strongly encouraged follow-up with psychiatrist for counseling and  medications.  Self-care, leisure activities encouraged.  Pap with high risk HPV, GC/chlamydia pending.    2007 [facility], 5:39 PM 01/14/2020

## 2020-01-15 LAB — PAP IG, CT-NG NAA, HPV HIGH-RISK
C. trachomatis RNA, TMA: NOT DETECTED
HPV DNA High Risk: NOT DETECTED
N. gonorrhoeae RNA, TMA: NOT DETECTED

## 2020-02-19 ENCOUNTER — Telehealth: Payer: Self-pay | Admitting: *Deleted

## 2020-02-19 NOTE — Telephone Encounter (Signed)
Patient called and left message requesting Rx for inhaler. I called and left message for patient to call.

## 2020-05-23 ENCOUNTER — Ambulatory Visit: Payer: BC Managed Care – PPO | Admitting: Obstetrics and Gynecology

## 2020-05-23 ENCOUNTER — Other Ambulatory Visit: Payer: Self-pay

## 2020-05-23 ENCOUNTER — Encounter: Payer: Self-pay | Admitting: Obstetrics and Gynecology

## 2020-05-23 VITALS — BP 118/80

## 2020-05-23 DIAGNOSIS — N898 Other specified noninflammatory disorders of vagina: Secondary | ICD-10-CM | POA: Diagnosis not present

## 2020-05-23 DIAGNOSIS — B373 Candidiasis of vulva and vagina: Secondary | ICD-10-CM

## 2020-05-23 DIAGNOSIS — B3731 Acute candidiasis of vulva and vagina: Secondary | ICD-10-CM

## 2020-05-23 DIAGNOSIS — Z113 Encounter for screening for infections with a predominantly sexual mode of transmission: Secondary | ICD-10-CM | POA: Diagnosis not present

## 2020-05-23 LAB — WET PREP FOR TRICH, YEAST, CLUE

## 2020-05-23 MED ORDER — FLUCONAZOLE 150 MG PO TABS: 150.0000 mg | ORAL_TABLET | ORAL | 0 refills | Status: AC

## 2020-05-23 NOTE — Progress Notes (Signed)
° °  Melissa Greene Apr 08, 1986 [id number]  SUBJECTIVE:  34 y.o. G0P0000 female presents for a vaginal infection check.  She thinks she might have a yeast infection having some vaginal irritation.  She has a new sexual partner who she previously has been acquainted with in the past.  She indicates over time she has a new sexual partner she typically will have a bacterial or yeast vaginitis.  She is not having any discharge or odor.  No itching.  Using condoms for birth control.  Not necessarily desiring conception at this time.  Current Outpatient Medications  Medication Sig Dispense Refill   acetaminophen (TYLENOL) 500 MG tablet Take 2 tablets (1,000 mg total) by mouth every 8 (eight) hours. 30 tablet 0   albuterol (VENTOLIN HFA) 108 (90 Base) MCG/ACT inhaler Inhale 2 puffs into the lungs every 6 (six) hours as needed for wheezing or shortness of breath.     cetirizine (ZYRTEC) 10 MG tablet Take 10 mg by mouth daily as needed for allergies.     diphenhydrAMINE (BENADRYL) 25 MG tablet Take 25 mg by mouth daily as needed for itching or allergies.      fluticasone (FLONASE) 50 MCG/ACT nasal spray Place 1 spray into both nostrils daily as needed for allergies or rhinitis.     risperiDONE (RISPERDAL) 0.5 MG tablet Take 0.5 mg by mouth at bedtime.      traZODone (DESYREL) 50 MG tablet Take 50 mg by mouth at bedtime.     No current facility-administered medications for this visit.   Allergies: Other, Penicillins, and Ceclor [cefaclor]  Patient's last menstrual period was 05/13/2020.  Past medical history,surgical history, problem list, medications, allergies, family history and social history were all reviewed and documented as reviewed in the EPIC chart.  ROS:  Feeling well. No dyspnea or chest pain on exertion.  No abdominal pain, change in bowel habits, black or bloody stools.  No urinary tract symptoms. GYN ROS: as described in HPI   OBJECTIVE:  BP 118/80    LMP 05/13/2020  The patient  appears well, alert, oriented x 3, in no distress. PELVIC EXAM: VULVA: normal appearing vulva with no masses, tenderness or lesions, VAGINA: normal appearing vagina with normal color, no lesions, thick yellow to gray discharge present, no appreciable odor, CERVIX: normal appearing cervix without discharge or lesions WET MOUNT done - results: hyphae, no clue cells, no trichomonas, moderate WBC, moderate bacteria, 13-20 epithelial cells/hpf GC/chlamydia probe also obtained  Chaperone: Kennon Portela present during the examination  ASSESSMENT:  34 y.o. G0P0000 here for vaginal candidiasis, STD screening with new sexual partner  PLAN:  GC/Chlamydia screen obtained Vaginal wet prep indicates vaginal candidiasis.  Will treat with Diflucan 150 mg by mouth 2 tablets 72 hours apart.  Call us if symptoms do not improve. I stressed the importance of contraception not desiring conception.  I offered to discuss birth control but she said she will just use condoms.  Plan B availability also reviewed.   Theresia Majors MD 05/23/20

## 2020-05-24 LAB — C. TRACHOMATIS/N. GONORRHOEAE RNA
C. trachomatis RNA, TMA: NOT DETECTED
N. gonorrhoeae RNA, TMA: NOT DETECTED

## 2020-07-29 ENCOUNTER — Other Ambulatory Visit: Payer: Self-pay

## 2020-07-29 ENCOUNTER — Encounter: Payer: Self-pay | Admitting: Nurse Practitioner

## 2020-07-29 ENCOUNTER — Ambulatory Visit: Payer: BC Managed Care – PPO | Admitting: Nurse Practitioner

## 2020-07-29 VITALS — BP 120/80

## 2020-07-29 DIAGNOSIS — B9689 Other specified bacterial agents as the cause of diseases classified elsewhere: Secondary | ICD-10-CM | POA: Diagnosis not present

## 2020-07-29 DIAGNOSIS — B373 Candidiasis of vulva and vagina: Secondary | ICD-10-CM | POA: Diagnosis not present

## 2020-07-29 DIAGNOSIS — N760 Acute vaginitis: Secondary | ICD-10-CM | POA: Diagnosis not present

## 2020-07-29 DIAGNOSIS — N898 Other specified noninflammatory disorders of vagina: Secondary | ICD-10-CM

## 2020-07-29 DIAGNOSIS — Z113 Encounter for screening for infections with a predominantly sexual mode of transmission: Secondary | ICD-10-CM

## 2020-07-29 DIAGNOSIS — Z7251 High risk heterosexual behavior: Secondary | ICD-10-CM

## 2020-07-29 DIAGNOSIS — Z30011 Encounter for initial prescription of contraceptive pills: Secondary | ICD-10-CM

## 2020-07-29 DIAGNOSIS — B3731 Acute candidiasis of vulva and vagina: Secondary | ICD-10-CM

## 2020-07-29 LAB — WET PREP FOR TRICH, YEAST, CLUE

## 2020-07-29 MED ORDER — NORGESTREL-ETHINYL ESTRADIOL 0.3-30 MG-MCG PO TABS: 1.0000 | ORAL_TABLET | Freq: Every day | ORAL | 11 refills | Status: DC

## 2020-07-29 MED ORDER — FLUCONAZOLE 150 MG PO TABS: 150.0000 mg | ORAL_TABLET | ORAL | 1 refills | Status: DC

## 2020-07-29 MED ORDER — METRONIDAZOLE 0.75 % VA GEL: 1.0000 | Freq: Two times a day (BID) | VAGINAL | 1 refills | Status: AC

## 2020-07-29 NOTE — Progress Notes (Signed)
   Acute Office Visit  Subjective:    Patient ID: Melissa Greene, female    DOB: Nov 02, 1986, 34 y.o.   MRN: [id number]   HPI 34 y.o. presents today for vaginal discharge and STD testing. She had unprotected intercourse 1 week ago. History of recurrent yeast infections and bacterial vaginosis. LMP 07/21/2020.    Review of Systems  Constitutional: Negative.   Genitourinary: Positive for vaginal discharge. Negative for dysuria and genital sores.       Objective:    Physical Exam Constitutional:      Appearance: Normal appearance.  Genitourinary:    General: Normal vulva.     Vagina: Vaginal discharge present. No erythema or lesions.     Cervix: Normal.     BP 120/80 (BP Location: Right Arm, Patient Position: Sitting, Cuff Size: Normal)   LMP 07/21/2020  Wt Readings from Last 3 Encounters:  01/14/20 264 lb (119.7 kg)  07/09/19 287 lb 8 oz (130.4 kg)  07/05/19 285 lb (129.3 kg)   Wet prep + yeast, + clue cells     Assessment & Plan:   Problem List Items Addressed This Visit      Genitourinary   BV (bacterial vaginosis)   Relevant Medications   fluconazole (DIFLUCAN) 150 MG tablet   metroNIDAZOLE (METROGEL) 0.75 % vaginal gel    Other Visit Diagnoses    Vulvovaginal candidiasis    -  Primary   Relevant Medications   fluconazole (DIFLUCAN) 150 MG tablet   Vaginal discharge       Relevant Orders   WET PREP FOR TRICH, YEAST, CLUE   Unprotected sexual intercourse       Relevant Orders   C. trachomatis/N. gonorrhoeae RNA   Screen for STD (sexually transmitted disease)       Relevant Orders   C. trachomatis/N. gonorrhoeae RNA   Oral contraception initial prescription       Relevant Medications   norgestrel-ethinyl estradiol (LO/OVRAL) 0.3-30 MG-MCG tablet     Plan: Wet prep showed yeast and clue cells. Diflucan 150 mg today and repeat in 3 days for a total of 2 doses. Metrogel 0.75% twice a day vaginally for 5 days. Gonorrhea/Chlamydia today, declines HIV and  RPR. She is considering birth control but does not want patch, ring, IUD, injectable, or implant. She is not sure if she will be consistent with pills but wants to try. Lo/Ovral 0.3-30 mg prescribed. Follow up if symptoms worsen or do not improve. She is agreeable to plan.      Olivia Mackie [facility], 3:38 PM 07/29/2020

## 2020-07-29 NOTE — Patient Instructions (Signed)
Bacterial Vaginosis  Bacterial vaginosis is a vaginal infection that occurs when the normal balance of bacteria in the vagina is disrupted. It results from an overgrowth of certain bacteria. This is the most common vaginal infection among women ages 15-44. Because bacterial vaginosis increases your risk for STIs (sexually transmitted infections), getting treated can help reduce your risk for chlamydia, gonorrhea, herpes, and HIV (human immunodeficiency virus). Treatment is also important for preventing complications in pregnant women, because this condition can cause an early (premature) delivery. What are the causes? This condition is caused by an increase in harmful bacteria that are normally present in small amounts in the vagina. However, the reason that the condition develops is not fully understood. What increases the risk? The following factors may make you more likely to develop this condition:  Having a new sexual partner or multiple sexual partners.  Having unprotected sex.  Douching.  Having an intrauterine device (IUD).  Smoking.  Drug and alcohol abuse.  Taking certain antibiotic medicines.  Being pregnant. You cannot get bacterial vaginosis from toilet seats, bedding, swimming pools, or contact with objects around you. What are the signs or symptoms? Symptoms of this condition include:  Grey or white vaginal discharge. The discharge can also be watery or foamy.  A fish-like odor with discharge, especially after sexual intercourse or during menstruation.  Itching in and around the vagina.  Burning or pain with urination. Some women with bacterial vaginosis have no signs or symptoms. How is this diagnosed? This condition is diagnosed based on:  Your medical history.  A physical exam of the vagina.  Testing a sample of vaginal fluid under a microscope to look for a large amount of bad bacteria or abnormal cells. Your health care provider may use a cotton swab or  a small wooden spatula to collect the sample. How is this treated? This condition is treated with antibiotics. These may be given as a pill, a vaginal cream, or a medicine that is put into the vagina (suppository). If the condition comes back after treatment, a second round of antibiotics may be needed. Follow these instructions at home: Medicines  Take over-the-counter and prescription medicines only as told by your health care provider.  Take or use your antibiotic as told by your health care provider. Do not stop taking or using the antibiotic even if you start to feel better. General instructions  If you have a female sexual partner, tell her that you have a vaginal infection. She should see her health care provider and be treated if she has symptoms. If you have a female sexual partner, he does not need treatment.  During treatment: ? Avoid sexual activity until you finish treatment. ? Do not douche. ? Avoid alcohol as directed by your health care provider. ? Avoid breastfeeding as directed by your health care provider.  Drink enough water and fluids to keep your urine clear or pale yellow.  Keep the area around your vagina and rectum clean. ? Wash the area daily with warm water. ? Wipe yourself from front to back after using the toilet.  Keep all follow-up visits as told by your health care provider. This is important. How is this prevented?  Do not douche.  Wash the outside of your vagina with warm water only.  Use protection when having sex. This includes latex condoms and dental dams.  Limit how many sexual partners you have. To help prevent bacterial vaginosis, it is best to have sex with just one partner (  monogamous).  Make sure you and your sexual partner are tested for STIs.  Wear cotton or cotton-lined underwear.  Avoid wearing tight pants and pantyhose, especially during summer.  Limit the amount of alcohol that you drink.  Do not use any products that contain  nicotine or tobacco, such as cigarettes and e-cigarettes. If you need help quitting, ask your health care provider.  Do not use illegal drugs. Where to find more information  Centers for Disease Control and Prevention: SolutionApps.co.za  American Sexual Health Association (ASHA): www.ashastd.org  U.S. Department of Health and Health and safety inspector, Office on Women's Health: ConventionalMedicines.si or http://www.anderson-williamson.info/ Contact a health care provider if:  Your symptoms do not improve, even after treatment.  You have more discharge or pain when urinating.  You have a fever.  You have pain in your abdomen.  You have pain during sex.  You have vaginal bleeding between periods. Summary  Bacterial vaginosis is a vaginal infection that occurs when the normal balance of bacteria in the vagina is disrupted.  Because bacterial vaginosis increases your risk for STIs (sexually transmitted infections), getting treated can help reduce your risk for chlamydia, gonorrhea, herpes, and HIV (human immunodeficiency virus). Treatment is also important for preventing complications in pregnant women, because the condition can cause an early (premature) delivery.  This condition is treated with antibiotic medicines. These may be given as a pill, a vaginal cream, or a medicine that is put into the vagina (suppository). This information is not intended to replace advice given to you by your health care provider. Make sure you discuss any questions you have with your health care provider. Document Revised: 10/21/2017 Document Reviewed: 07/24/2016 Elsevier Patient Education  2020 Elsevier Inc. Vaginal Yeast Infection, Adult  Vaginal yeast infection is a condition that causes vaginal discharge as well as soreness, swelling, and redness (inflammation) of the vagina. This is a common condition. Some women get this infection frequently. What are the causes? This condition is  caused by a change in the normal balance of the yeast (candida) and bacteria that live in the vagina. This change causes an overgrowth of yeast, which causes the inflammation. What increases the risk? The condition is more likely to develop in women who:  Take antibiotic medicines.  Have diabetes.  Take birth control pills.  Are pregnant.  Douche often.  Have a weak body defense system (immune system).  Have been taking steroid medicines for a long time.  Frequently wear tight clothing. What are the signs or symptoms? Symptoms of this condition include:  White, thick, creamy vaginal discharge.  Swelling, itching, redness, and irritation of the vagina. The lips of the vagina (vulva) may be affected as well.  Pain or a burning feeling while urinating.  Pain during sex. How is this diagnosed? This condition is diagnosed based on:  Your medical history.  A physical exam.  A pelvic exam. Your health care provider will examine a sample of your vaginal discharge under a microscope. Your health care provider may send this sample for testing to confirm the diagnosis. How is this treated? This condition is treated with medicine. Medicines may be over-the-counter or prescription. You may be told to use one or more of the following:  Medicine that is taken by mouth (orally).  Medicine that is applied as a cream (topically).  Medicine that is inserted directly into the vagina (suppository). Follow these instructions at home:  Lifestyle  Do not have sex until your health care provider approves. Tell your sex  partner that you have a yeast infection. That person should go to his or her health care provider and ask if they should also be treated.  Do not wear tight clothes, such as pantyhose or tight pants.  Wear breathable cotton underwear. General instructions  Take or apply over-the-counter and prescription medicines only as told by your health care provider.  Eat more  yogurt. This may help to keep your yeast infection from returning.  Do not use tampons until your health care provider approves.  Try taking a sitz bath to help with discomfort. This is a warm water bath that is taken while you are sitting down. The water should only come up to your hips and should cover your buttocks. Do this 3-4 times per day or as told by your health care provider.  Do not douche.  If you have diabetes, keep your blood sugar levels under control.  Keep all follow-up visits as told by your health care provider. This is important. Contact a health care provider if:  You have a fever.  Your symptoms go away and then return.  Your symptoms do not get better with treatment.  Your symptoms get worse.  You have new symptoms.  You develop blisters in or around your vagina.  You have blood coming from your vagina and it is not your menstrual period.  You develop pain in your abdomen. Summary  Vaginal yeast infection is a condition that causes discharge as well as soreness, swelling, and redness (inflammation) of the vagina.  This condition is treated with medicine. Medicines may be over-the-counter or prescription.  Take or apply over-the-counter and prescription medicines only as told by your health care provider.  Do not douche. Do not have sex or use tampons until your health care provider approves.  Contact a health care provider if your symptoms do not get better with treatment or your symptoms go away and then return. This information is not intended to replace advice given to you by your health care provider. Make sure you discuss any questions you have with your health care provider. Document Revised: 06/08/2019 Document Reviewed: 03/27/2018 Elsevier Patient Education  2020 [employer].

## 2020-07-30 LAB — C. TRACHOMATIS/N. GONORRHOEAE RNA
C. trachomatis RNA, TMA: NOT DETECTED
N. gonorrhoeae RNA, TMA: NOT DETECTED

## 2020-09-12 ENCOUNTER — Other Ambulatory Visit: Payer: Self-pay

## 2020-09-12 ENCOUNTER — Ambulatory Visit: Payer: BC Managed Care – PPO | Admitting: Obstetrics and Gynecology

## 2020-09-12 ENCOUNTER — Encounter: Payer: Self-pay | Admitting: Obstetrics and Gynecology

## 2020-09-12 VITALS — BP 126/80

## 2020-09-12 DIAGNOSIS — N760 Acute vaginitis: Secondary | ICD-10-CM | POA: Diagnosis not present

## 2020-09-12 DIAGNOSIS — N898 Other specified noninflammatory disorders of vagina: Secondary | ICD-10-CM

## 2020-09-12 DIAGNOSIS — B373 Candidiasis of vulva and vagina: Secondary | ICD-10-CM | POA: Diagnosis not present

## 2020-09-12 DIAGNOSIS — Z113 Encounter for screening for infections with a predominantly sexual mode of transmission: Secondary | ICD-10-CM

## 2020-09-12 DIAGNOSIS — B3731 Acute candidiasis of vulva and vagina: Secondary | ICD-10-CM

## 2020-09-12 DIAGNOSIS — R300 Dysuria: Secondary | ICD-10-CM

## 2020-09-12 LAB — WET PREP FOR TRICH, YEAST, CLUE

## 2020-09-12 MED ORDER — TERCONAZOLE 0.4 % VA CREA: 1.0000 | TOPICAL_CREAM | Freq: Every day | VAGINAL | 0 refills | Status: AC

## 2020-09-12 NOTE — Addendum Note (Signed)
Addended by: Tito Dine on: 09/12/2020 12:18 PM   Modules accepted: Orders

## 2020-09-12 NOTE — Progress Notes (Signed)
   Melissa Greene Feb 28, 1986 [id number]  SUBJECTIVE:  34 y.o. G0P0000 female with recurrent vaginitis symptoms presents with continuation and worsening of genital and vulvar irritation symptoms since her last treatment 07/29/2020, which time she treated with MetroGel and fluconazole tablets.  She is also having some burning with urination.  Does not notice any vaginal discharge this time.  She has remained abstinent since her last testing but wonders if she has an unrecognized STD that would be causing the symptoms.  She is not using any vaginal probiotics.  She did switch her soap to another type of Dove than what she normally uses.  She is not using any vaginal products.  She did try taking 1 tablet of leftover fluconazole with no improvement in her symptoms.  Current Outpatient Medications  Medication Sig Dispense Refill  . acetaminophen (TYLENOL) 500 MG tablet Take 2 tablets (1,000 mg total) by mouth every 8 (eight) hours. 30 tablet 0  . albuterol (VENTOLIN HFA) 108 (90 Base) MCG/ACT inhaler Inhale 2 puffs into the lungs every 6 (six) hours as needed for wheezing or shortness of breath.    . cetirizine (ZYRTEC) 10 MG tablet Take 10 mg by mouth daily as needed for allergies.    . diphenhydrAMINE (BENADRYL) 25 MG tablet Take 25 mg by mouth daily as needed for itching or allergies.     . fluticasone (FLONASE) 50 MCG/ACT nasal spray Place 1 spray into both nostrils daily as needed for allergies or rhinitis.    . norgestrel-ethinyl estradiol (LO/OVRAL) 0.3-30 MG-MCG tablet Take 1 tablet by mouth daily. 28 tablet 11  . risperiDONE (RISPERDAL) 0.5 MG tablet Take 0.5 mg by mouth at bedtime.     . traZODone (DESYREL) 50 MG tablet Take 50 mg by mouth at bedtime.     No current facility-administered medications for this visit.   Allergies: Other, Penicillins, and Ceclor [cefaclor]  Patient's last menstrual period was 08/27/2020.  Past medical history,surgical history, problem list, medications,  allergies, family history and social history were all reviewed and documented as reviewed in the EPIC chart.  ROS: Pertinent positives and negatives as reviewed in HPI    OBJECTIVE:  BP 126/80 (BP Location: Right Arm, Patient Position: Sitting, Cuff Size: Large)   LMP 08/27/2020  The patient appears well, alert, oriented, in no distress. PELVIC EXAM: VULVA: normal appearing vulva with no masses, tenderness or lesions, subtle pasty appearance to the bilateral labia symmetrically, VAGINA: normal appearing vagina with normal color, moderate thick white curdled discharge present throughout the vagina, CERVIX: normal appearing cervix without discharge or lesions, WET MOUNT done - results: +hyphae, no clue cells or trichomonas, moderate WBC, many bacteria, normal epithelial cells Urinalysis unremarkable  Chaperone: Bari Mantis Bonham present during the examination  ASSESSMENT:  34 y.o. G0P0000 here for recurrent vaginal candidiasis  PLAN:  Terazol 0.4% vaginal cream, one applicator for vaginal application at nighttime for 7 nights. I recommended that she start using a vaginal probiotic after treatment to hopefully help reestablish vaginal balance and pH.  Recommended avoiding soap.  If still having recurrent vaginal candidiasis may need to consider suppressive therapies.  GC/Chlamydia probe obtained for reassurance purposes and will let her know the results.   Theresia Majors MD 09/12/20

## 2020-09-14 LAB — URINE CULTURE
MICRO NUMBER:: 11107199
SPECIMEN QUALITY:: ADEQUATE

## 2020-09-14 LAB — URINALYSIS, COMPLETE W/RFL CULTURE
Bacteria, UA: NONE SEEN /HPF
Bilirubin Urine: NEGATIVE
Glucose, UA: NEGATIVE
Hyaline Cast: NONE SEEN /LPF
Ketones, ur: NEGATIVE
Leukocyte Esterase: NEGATIVE
Nitrites, Initial: NEGATIVE
Protein, ur: NEGATIVE
Specific Gravity, Urine: 1.025 (ref 1.001–1.03)
pH: 7.5 (ref 5.0–8.0)

## 2020-09-14 LAB — C. TRACHOMATIS/N. GONORRHOEAE RNA
C. trachomatis RNA, TMA: NOT DETECTED
N. gonorrhoeae RNA, TMA: NOT DETECTED

## 2020-09-14 LAB — CULTURE INDICATED

## 2020-12-10 ENCOUNTER — Encounter: Payer: Self-pay | Admitting: Nurse Practitioner

## 2020-12-10 ENCOUNTER — Ambulatory Visit: Payer: BC Managed Care – PPO | Admitting: Nurse Practitioner

## 2020-12-10 ENCOUNTER — Other Ambulatory Visit: Payer: Self-pay

## 2020-12-10 VITALS — BP 118/82 | HR 82 | Resp 20

## 2020-12-10 DIAGNOSIS — B373 Candidiasis of vulva and vagina: Secondary | ICD-10-CM

## 2020-12-10 DIAGNOSIS — N898 Other specified noninflammatory disorders of vagina: Secondary | ICD-10-CM | POA: Diagnosis not present

## 2020-12-10 DIAGNOSIS — B3731 Acute candidiasis of vulva and vagina: Secondary | ICD-10-CM

## 2020-12-10 DIAGNOSIS — Z113 Encounter for screening for infections with a predominantly sexual mode of transmission: Secondary | ICD-10-CM

## 2020-12-10 LAB — WET PREP FOR TRICH, YEAST, CLUE

## 2020-12-10 MED ORDER — FLUCONAZOLE 150 MG PO TABS: 150.0000 mg | ORAL_TABLET | ORAL | 0 refills | Status: DC

## 2020-12-10 NOTE — Progress Notes (Signed)
   Acute Office Visit  Subjective:    Patient ID: Melissa Greene, female    DOB: [birth year], 35 y.o.   MRN: [id number]   HPI 35 y.o. presents today for vaginal itching and discharge that started 1 week ago. She had similar symptoms last month and used leftover Metrogel with improvement but symptoms have returned. She has been seen 3 times since July 2021 for yeast infections with recommendations to start vaginal probiotic and consider suppressive medications if recurrence continues. She has not been consistent with this. She would like STD screening today, GC/Chlamydia only.    Review of Systems  Constitutional: Negative.   Genitourinary: Positive for vaginal discharge. Negative for dysuria, frequency and urgency.       Vaginal itching       Objective:    Physical Exam Constitutional:      Appearance: Normal appearance.  Genitourinary:    General: Normal vulva.     Vagina: Vaginal discharge and erythema present.     BP 118/82 (BP Location: Right Arm, Patient Position: Sitting)   Pulse 82   Resp 20   LMP 11/16/2020 (Exact Date)  Wt Readings from Last 3 Encounters:  01/14/20 264 lb (119.7 kg)  07/09/19 287 lb 8 oz (130.4 kg)  07/05/19 285 lb (129.3 kg)   Wet prep + yeast     Assessment & Plan:   Problem List Items Addressed This Visit   None   Visit Diagnoses    Vulvovaginal candidiasis    -  Primary   Relevant Medications   fluconazole (DIFLUCAN) 150 MG tablet   Vaginal discharge       Relevant Orders   WET PREP FOR TRICH, YEAST, CLUE   Screen for STD (sexually transmitted disease)       Relevant Orders   SURESWAB CT/NG/T. vaginalis     Plan: Wet prep positive for yeast.  Diflucan 150 mg today and repeat in 3 days if symptoms persist for total of 2 doses.  Due to history of recurrence she will do Diflucan 150 mg tablet weekly x12 weeks after treating acute symptoms.  Also recommend being consistent with vaginal probiotic.  She is agreeable to  plan.     Olivia Mackie [facility], 3:05 PM 12/10/2020

## 2020-12-11 LAB — SURESWAB CT/NG/T. VAGINALIS
C. trachomatis RNA, TMA: NOT DETECTED
N. gonorrhoeae RNA, TMA: NOT DETECTED
Trichomonas vaginalis RNA: NOT DETECTED

## 2020-12-29 ENCOUNTER — Encounter: Payer: Self-pay | Admitting: Nurse Practitioner

## 2020-12-29 ENCOUNTER — Ambulatory Visit: Payer: BC Managed Care – PPO | Admitting: Nurse Practitioner

## 2020-12-29 ENCOUNTER — Other Ambulatory Visit: Payer: Self-pay

## 2020-12-29 VITALS — BP 124/82

## 2020-12-29 DIAGNOSIS — N762 Acute vulvitis: Secondary | ICD-10-CM

## 2020-12-29 DIAGNOSIS — N760 Acute vaginitis: Secondary | ICD-10-CM

## 2020-12-29 DIAGNOSIS — B9689 Other specified bacterial agents as the cause of diseases classified elsewhere: Secondary | ICD-10-CM

## 2020-12-29 DIAGNOSIS — N898 Other specified noninflammatory disorders of vagina: Secondary | ICD-10-CM | POA: Diagnosis not present

## 2020-12-29 LAB — WET PREP FOR TRICH, YEAST, CLUE

## 2020-12-29 MED ORDER — METRONIDAZOLE 500 MG PO TABS: 500.0000 mg | ORAL_TABLET | Freq: Two times a day (BID) | ORAL | 0 refills | Status: AC

## 2020-12-29 MED ORDER — HYDROCORTISONE 1 % EX OINT: 1.0000 "application " | TOPICAL_OINTMENT | Freq: Two times a day (BID) | CUTANEOUS | 0 refills | Status: DC

## 2020-12-29 NOTE — Progress Notes (Signed)
    Acute Office Visit  Subjective:    Patient ID: Melissa Greene, female    DOB: [birth year], 35 y.o.   MRN: [id number]   HPI 35 y.o. presents today for vulvar irritation. It is external only, red, and bumpy. Denies vaginal itching, discharge, or odor. Is currently taking Diflucan weekly for recurrent yeast infections and feels this has helped with previous symptoms. Negative STD testing 12/11/2019, no intercourse since then.    Review of Systems  Constitutional: Negative.   Genitourinary: Positive for vaginal pain (vulvar irritation). Negative for vaginal discharge.       Objective:    Physical Exam Constitutional:      Appearance: Normal appearance.  Genitourinary:    Vagina: Vaginal discharge present.     Cervix: Normal.     Comments: Mild vulvar erythema    BP 124/82 (Cuff Size: Large)   LMP 12/15/2020 (Exact Date)  Wt Readings from Last 3 Encounters:  01/14/20 264 lb (119.7 kg)  07/09/19 287 lb 8 oz (130.4 kg)  07/05/19 285 lb (129.3 kg)   Wet prep + clue cells     Assessment & Plan:   Problem List Items Addressed This Visit   None   Visit Diagnoses    Bacterial vaginosis    -  Primary   Relevant Medications   metroNIDAZOLE (FLAGYL) 500 MG tablet   Vaginal irritation       Relevant Orders   WET PREP FOR TRICH, YEAST, CLUE   Acute vulvitis       Relevant Medications   hydrocortisone 1 % ointment     Plan: Wet prep and exam consistent with bacterial vaginosis. Flagyl 500 mg twice weekly x 7 days. Take with food and avoid alcohol. Apply hydrocortisone ointment twice daily for 7-10 days for vulvar irritation. Recommend boric acid vaginal suppositories twice weekly. If symptoms worsen or do not improve she will return to clinic. She is agreeable to plan.      Olivia Mackie [facility], 3:51 PM 12/29/2020

## 2020-12-29 NOTE — Patient Instructions (Signed)
Boric Acid suppository twice weekly

## 2021-01-14 ENCOUNTER — Encounter: Payer: BC Managed Care – PPO | Admitting: Nurse Practitioner

## 2021-01-19 ENCOUNTER — Ambulatory Visit (INDEPENDENT_AMBULATORY_CARE_PROVIDER_SITE_OTHER): Payer: BC Managed Care – PPO | Admitting: Nurse Practitioner

## 2021-01-19 ENCOUNTER — Encounter: Payer: Self-pay | Admitting: Nurse Practitioner

## 2021-01-19 ENCOUNTER — Other Ambulatory Visit: Payer: Self-pay

## 2021-01-19 VITALS — BP 126/78 | Ht 65.0 in | Wt 253.0 lb

## 2021-01-19 DIAGNOSIS — Z01419 Encounter for gynecological examination (general) (routine) without abnormal findings: Secondary | ICD-10-CM | POA: Diagnosis not present

## 2021-01-19 DIAGNOSIS — Z3009 Encounter for other general counseling and advice on contraception: Secondary | ICD-10-CM

## 2021-01-19 DIAGNOSIS — R5383 Other fatigue: Secondary | ICD-10-CM

## 2021-01-19 NOTE — Patient Instructions (Signed)
Health Maintenance, Female Adopting a healthy lifestyle and getting preventive care are important in promoting health and wellness. Ask your health care provider about:  The right schedule for you to have regular tests and exams.  Things you can do on your own to prevent diseases and keep yourself healthy. What should I know about diet, weight, and exercise? Eat a healthy diet  Eat a diet that includes plenty of vegetables, fruits, low-fat dairy products, and lean protein.  Do not eat a lot of foods that are high in solid fats, added sugars, or sodium.   Maintain a healthy weight Body mass index (BMI) is used to identify weight problems. It estimates body fat based on height and weight. Your health care provider can help determine your BMI and help you achieve or maintain a healthy weight. Get regular exercise Get regular exercise. This is one of the most important things you can do for your health. Most adults should:  Exercise for at least 150 minutes each week. The exercise should increase your heart rate and make you sweat (moderate-intensity exercise).  Do strengthening exercises at least twice a week. This is in addition to the moderate-intensity exercise.  Spend less time sitting. Even light physical activity can be beneficial. Watch cholesterol and blood lipids Have your blood tested for lipids and cholesterol at 36 years of age, then have this test every 5 years. Have your cholesterol levels checked more often if:  Your lipid or cholesterol levels are high.  You are older than 36 years of age.  You are at high risk for heart disease. What should I know about cancer screening? Depending on your health history and family history, you may need to have cancer screening at various ages. This may include screening for:  Breast cancer.  Cervical cancer.  Colorectal cancer.  Skin cancer.  Lung cancer. What should I know about heart disease, diabetes, and high blood  pressure? Blood pressure and heart disease  High blood pressure causes heart disease and increases the risk of stroke. This is more likely to develop in people who have high blood pressure readings, are of African descent, or are overweight.  Have your blood pressure checked: ? Every 3-5 years if you are 18-39 years of age. ? Every year if you are 40 years old or older. Diabetes Have regular diabetes screenings. This checks your fasting blood sugar level. Have the screening done:  Once every three years after age 40 if you are at a normal weight and have a low risk for diabetes.  More often and at a younger age if you are overweight or have a high risk for diabetes. What should I know about preventing infection? Hepatitis B If you have a higher risk for hepatitis B, you should be screened for this virus. Talk with your health care provider to find out if you are at risk for hepatitis B infection. Hepatitis C Testing is recommended for:  Everyone born from 1945 through 1965.  Anyone with known risk factors for hepatitis C. Sexually transmitted infections (STIs)  Get screened for STIs, including gonorrhea and chlamydia, if: ? You are sexually active and are younger than 36 years of age. ? You are older than 36 years of age and your health care provider tells you that you are at risk for this type of infection. ? Your sexual activity has changed since you were last screened, and you are at increased risk for chlamydia or gonorrhea. Ask your health care provider   if you are at risk.  Ask your health care provider about whether you are at high risk for HIV. Your health care provider may recommend a prescription medicine to help prevent HIV infection. If you choose to take medicine to prevent HIV, you should first get tested for HIV. You should then be tested every 3 months for as long as you are taking the medicine. Pregnancy  If you are about to stop having your period (premenopausal) and  you may become pregnant, seek counseling before you get pregnant.  Take 400 to 800 micrograms (mcg) of folic acid every day if you become pregnant.  Ask for birth control (contraception) if you want to prevent pregnancy. Osteoporosis and menopause Osteoporosis is a disease in which the bones lose minerals and strength with aging. This can result in bone fractures. If you are 65 years old or older, or if you are at risk for osteoporosis and fractures, ask your health care provider if you should:  Be screened for bone loss.  Take a calcium or vitamin D supplement to lower your risk of fractures.  Be given hormone replacement therapy (HRT) to treat symptoms of menopause. Follow these instructions at home: Lifestyle  Do not use any products that contain nicotine or tobacco, such as cigarettes, e-cigarettes, and chewing tobacco. If you need help quitting, ask your health care provider.  Do not use street drugs.  Do not share needles.  Ask your health care provider for help if you need support or information about quitting drugs. Alcohol use  Do not drink alcohol if: ? Your health care provider tells you not to drink. ? You are pregnant, may be pregnant, or are planning to become pregnant.  If you drink alcohol: ? Limit how much you use to 0-1 drink a day. ? Limit intake if you are breastfeeding.  Be aware of how much alcohol is in your drink. In the [place] one drink equals one 12 oz bottle of beer (355 mL), one 5 oz glass of wine (148 mL), or one 1 oz glass of hard liquor (44 mL). General instructions  Schedule regular health, dental, and eye exams.  Stay current with your vaccines.  Tell your health care provider if: ? You often feel depressed. ? You have ever been abused or do not feel safe at home. Summary  Adopting a healthy lifestyle and getting preventive care are important in promoting health and wellness.  Follow your health care provider's instructions about healthy  diet, exercising, and getting tested or screened for diseases.  Follow your health care provider's instructions on monitoring your cholesterol and blood pressure. This information is not intended to replace advice given to you by your health care provider. Make sure you discuss any questions you have with your health care provider. Document Revised: 11/01/2018 Document Reviewed: 11/01/2018 Elsevier Patient Education  2021 Elsevier Inc.  

## 2021-01-19 NOTE — Progress Notes (Signed)
   Melissa Greene [birth year] [id number]   History:  35 y.o. G0 presents for annual exam without GYN complaints. She complains of fatigue that started 1-2 weeks ago. She went to urgent care last week for dizziness and fatigue and was treated for inner ear infection. She says dizziness is better but fatigue remains. Monthly cycles. Did not start OCPs. Had unprotected intercourse over the weekend and took Plan B today.  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 History of recurrent BV and yeast - currently taking Diflucan weekly x 12 weeks with recommendations to start boric acid suppositories and probiotic. 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   Gynecologic History Patient's last menstrual period was 01/10/2021.   Contraception/Family planning: OCP (estrogen/progesterone)  Health Maintenance Last Pap: 01/14/2020. Results were: normal Last mammogram:  Never Last colonoscopy: N/A Last Dexa: N/A  Past medical history, past surgical history, family history and social history were all reviewed and documented in the EPIC chart.  ROS:  A ROS was performed and pertinent positives and negatives are included.  Exam:  Vitals:   01/19/21 1511  BP: 126/78  Weight: 253 lb (114.8 kg)  Height: 5\' 5"  (1.651 m)   Body mass index is 42.1 kg/m.  General appearance:  Normal Thyroid:  Symmetrical, normal in size, without palpable masses or nodularity. Respiratory  Auscultation:  Clear without wheezing or rhonchi Cardiovascular  Auscultation:  Regular rate, without rubs, murmurs or gallops  Edema/varicosities:  Not grossly evident Abdominal  Soft,nontender, without masses, guarding or rebound.  Liver/spleen:  No organomegaly noted  Hernia:  None appreciated  Skin  Inspection:  Grossly normal   Breasts: Examined lying and sitting.   Right: Without masses, retractions, discharge or axillary adenopathy.   Left: Without masses, retractions, discharge or axillary adenopathy. Gentitourinary   Inguinal/mons:  Normal  without inguinal adenopathy  External genitalia:  Normal  BUS/Urethra/Skene's glands:  Normal  Vagina:  Normal  Cervix:  Normal  Uterus:  Normal in size, shape and contour.  Midline and mobile  Adnexa/parametria:     Rt: Without masses or tenderness.   Lt: Without masses or tenderness.  Anus and perineum: Normal  Assessment/Plan:  35 y.o. G0 for annual exam.   Well female exam with routine gynecological exam - Education provided on SBEs, importance of preventative screenings, current guidelines, high calcium diet, regular exercise, and multivitamin daily.   Fatigue, unspecified type - Plan: CBC with Differential/Platelet, Comprehensive metabolic panel, TSH, B12. She complains of fatigue that started 1-2 weeks ago. She went to urgent care last week for dizziness and fatigue and was treated for inner ear infection. She says dizziness is better but fatigue remains.  Encounter for other general counseling and advice on contraception - discussed safe sex practices and recommendations to start OCPs or use condoms consistently. Wait until cycle to start OCPs to rule out pregnancy.   Screening for cervical cancer - 2006 CIN-1, subsequent paps normal. Will repeat at 5-year interval per guidelines.   Follow up in 1 year for annual.   2007 [facility], 3:36 PM 01/19/2021

## 2021-01-21 ENCOUNTER — Encounter (HOSPITAL_COMMUNITY): Payer: Self-pay | Admitting: *Deleted

## 2021-01-23 LAB — CBC WITH DIFFERENTIAL/PLATELET
Absolute Monocytes: 502 cells/uL (ref 200–950)
Basophils Absolute: 38 cells/uL (ref 0–200)
Basophils Relative: 0.5 %
Eosinophils Absolute: 198 cells/uL (ref 15–500)
Eosinophils Relative: 2.6 %
HCT: 44.9 % (ref 35.0–45.0)
Hemoglobin: 14.1 g/dL (ref 11.7–15.5)
Lymphs Abs: 3276 cells/uL (ref 850–3900)
MCH: 24.7 pg — ABNORMAL LOW (ref 27.0–33.0)
MCHC: 31.4 g/dL — ABNORMAL LOW (ref 32.0–36.0)
MCV: 78.8 fL — ABNORMAL LOW (ref 80.0–100.0)
MPV: 10.4 fL (ref 7.5–12.5)
Monocytes Relative: 6.6 %
Neutro Abs: 3587 cells/uL (ref 1500–7800)
Neutrophils Relative %: 47.2 %
Platelets: 369 10*3/uL (ref 140–400)
RBC: 5.7 10*6/uL — ABNORMAL HIGH (ref 3.80–5.10)
RDW: 14 % (ref 11.0–15.0)
Total Lymphocyte: 43.1 %
WBC: 7.6 10*3/uL (ref 3.8–10.8)

## 2021-01-23 LAB — COMPREHENSIVE METABOLIC PANEL
AG Ratio: 1.5 (calc) (ref 1.0–2.5)
ALT: 18 U/L (ref 6–29)
AST: 22 U/L (ref 10–30)
Albumin: 4.2 g/dL (ref 3.6–5.1)
Alkaline phosphatase (APISO): 61 U/L (ref 31–125)
BUN: 18 mg/dL (ref 7–25)
CO2: 21 mmol/L (ref 20–32)
Calcium: 9.3 mg/dL (ref 8.6–10.2)
Chloride: 109 mmol/L (ref 98–110)
Creat: 0.83 mg/dL (ref 0.50–1.10)
Globulin: 2.8 g/dL (calc) (ref 1.9–3.7)
Glucose, Bld: 123 mg/dL — ABNORMAL HIGH (ref 65–99)
Potassium: 3.9 mmol/L (ref 3.5–5.3)
Sodium: 140 mmol/L (ref 135–146)
Total Bilirubin: 0.4 mg/dL (ref 0.2–1.2)
Total Protein: 7 g/dL (ref 6.1–8.1)

## 2021-01-23 LAB — TSH: TSH: 1.32 mIU/L

## 2021-01-23 LAB — VITAMIN B12: Vitamin B-12: 915 pg/mL (ref 200–1100)

## 2021-01-23 LAB — HEMOGLOBIN A1C W/OUT EAG: Hgb A1c MFr Bld: 5.4 % of total Hgb (ref <5.7)

## 2021-06-01 ENCOUNTER — Encounter: Payer: Self-pay | Admitting: Nurse Practitioner

## 2021-06-01 ENCOUNTER — Other Ambulatory Visit: Payer: Self-pay

## 2021-06-01 ENCOUNTER — Ambulatory Visit: Payer: BC Managed Care – PPO | Admitting: Nurse Practitioner

## 2021-06-01 VITALS — BP 108/64 | HR 84

## 2021-06-01 DIAGNOSIS — Z113 Encounter for screening for infections with a predominantly sexual mode of transmission: Secondary | ICD-10-CM

## 2021-06-01 DIAGNOSIS — N898 Other specified noninflammatory disorders of vagina: Secondary | ICD-10-CM | POA: Diagnosis not present

## 2021-06-01 DIAGNOSIS — R300 Dysuria: Secondary | ICD-10-CM | POA: Diagnosis not present

## 2021-06-01 LAB — WET PREP FOR TRICH, YEAST, CLUE

## 2021-06-01 NOTE — Progress Notes (Signed)
   Acute Office Visit  Subjective:    Patient ID: Melissa Greene, female    DOB: [birth year], 35 y.o.   MRN: [id number]   HPI 35 y.o. presents today for dysuria and vaginal odor. She would like STD testing today, declines HIV/RPR. Sexually active with same partner. Thinking about trying to conceive.    Review of Systems  Constitutional: Negative.   Genitourinary:  Positive for dysuria and vaginal discharge. Negative for frequency, hematuria and urgency.       Vaginal odor      Objective:    Physical Exam Constitutional:      Appearance: Normal appearance.  Genitourinary:    General: Normal vulva.     Vagina: Vaginal discharge (brown from old menstrual blood) present. No erythema.    BP 108/64 (Cuff Size: Large)   Pulse 84   LMP 05/24/2021 (Exact Date)   SpO2 98%  Wt Readings from Last 3 Encounters:  01/19/21 253 lb (114.8 kg)  01/14/20 264 lb (119.7 kg)  07/09/19 287 lb 8 oz (130.4 kg)   Wet prep negative UA negative leukocytes, negative nitrates, 2+ blood, SG 1.025, cloudy/yellow. Microscopic: wbc 0.5, rbc 10-20, moderate bacteria     Assessment & Plan:   Problem List Items Addressed This Visit   None Visit Diagnoses     Dysuria    -  Primary   Relevant Orders   Urinalysis,Complete w/RFL Culture   Vaginal odor       Relevant Orders   C. trachomatis/N. gonorrhoeae RNA   WET PREP FOR TRICH, YEAST, CLUE   Screen for STD (sexually transmitted disease)       Relevant Orders   C. trachomatis/N. gonorrhoeae RNA   WET PREP FOR TRICH, YEAST, CLUE      Plan: Negative wet prep. Gonorrhea/chlamydia pending. Urinalysis unremarkable - blood present from menses but will reflex to culture. Will triage based on results. She is agreeable to plan.      Olivia Mackie DNP, 4:04 PM 06/01/2021

## 2021-06-02 LAB — C. TRACHOMATIS/N. GONORRHOEAE RNA
C. trachomatis RNA, TMA: NOT DETECTED
N. gonorrhoeae RNA, TMA: NOT DETECTED

## 2021-06-03 LAB — URINALYSIS, COMPLETE W/RFL CULTURE
Bilirubin Urine: NEGATIVE
Glucose, UA: NEGATIVE
Hyaline Cast: NONE SEEN /LPF
Ketones, ur: NEGATIVE
Leukocyte Esterase: NEGATIVE
Nitrites, Initial: NEGATIVE
Protein, ur: NEGATIVE
Specific Gravity, Urine: 1.025 (ref 1.001–1.035)
pH: 5.5 (ref 5.0–8.0)

## 2021-06-03 LAB — CULTURE INDICATED

## 2021-06-03 LAB — URINE CULTURE
MICRO NUMBER:: 12103182
SPECIMEN QUALITY:: ADEQUATE

## 2022-01-11 ENCOUNTER — Ambulatory Visit: Payer: BC Managed Care – PPO | Admitting: Nurse Practitioner

## 2022-01-11 ENCOUNTER — Other Ambulatory Visit: Payer: Self-pay

## 2022-01-11 ENCOUNTER — Encounter: Payer: Self-pay | Admitting: Nurse Practitioner

## 2022-01-11 VITALS — BP 124/82

## 2022-01-11 DIAGNOSIS — Z113 Encounter for screening for infections with a predominantly sexual mode of transmission: Secondary | ICD-10-CM | POA: Diagnosis not present

## 2022-01-11 DIAGNOSIS — B3731 Acute candidiasis of vulva and vagina: Secondary | ICD-10-CM

## 2022-01-11 DIAGNOSIS — N898 Other specified noninflammatory disorders of vagina: Secondary | ICD-10-CM

## 2022-01-11 LAB — WET PREP FOR TRICH, YEAST, CLUE

## 2022-01-11 MED ORDER — FLUCONAZOLE 150 MG PO TABS: 150.0000 mg | ORAL_TABLET | ORAL | 0 refills | Status: DC

## 2022-01-11 NOTE — Progress Notes (Signed)
° °  Acute Office Visit  Subjective:    Patient ID: Melissa Greene, female    DOB: [birth year], 36 y.o.   MRN: [id number]   HPI 36 y.o. presents today for vaginal irritation and discharge. She recently switched to different feminine pads. Would like STD screening today.    Review of Systems  Constitutional: Negative.   Genitourinary:  Positive for vaginal discharge and vaginal pain (Irritation, itching).      Objective:    Physical Exam Constitutional:      Appearance: Normal appearance.  Genitourinary:    General: Normal vulva.     Vagina: Vaginal discharge present. No erythema.     Cervix: Normal.    BP 124/82    LMP 12/16/2021  Wt Readings from Last 3 Encounters:  01/19/21 253 lb (114.8 kg)  01/14/20 264 lb (119.7 kg)  07/09/19 287 lb 8 oz (130.4 kg)   Wet prep + yeast     Patient informed chaperone available to be present for breast and pelvic exam. Patient has requested no chaperone to be present. Patient has been advised what will be completed during breast and pelvic exam.   Assessment & Plan:   Problem List Items Addressed This Visit   None Visit Diagnoses     Vaginal candidiasis    -  Primary   Relevant Medications   fluconazole (DIFLUCAN) 150 MG tablet   Vaginal irritation       Relevant Orders   WET PREP FOR TRICH, YEAST, CLUE   Screen for STD (sexually transmitted disease)       Relevant Orders   C. trachomatis/N. gonorrhoeae RNA      Plan: Wet prep positive for yeast - Diflucan 150 mg today and repeat in 3 days for total of 2 doses. Gonorrhea, chlamydia pending.      Olivia Mackie DNP, 2:36 PM 01/11/2022

## 2022-01-12 LAB — C. TRACHOMATIS/N. GONORRHOEAE RNA
C. trachomatis RNA, TMA: NOT DETECTED
N. gonorrhoeae RNA, TMA: NOT DETECTED

## 2022-01-28 ENCOUNTER — Encounter (HOSPITAL_COMMUNITY): Payer: Self-pay | Admitting: *Deleted

## 2022-03-15 ENCOUNTER — Telehealth: Payer: Self-pay | Admitting: *Deleted

## 2022-03-15 ENCOUNTER — Other Ambulatory Visit: Payer: Self-pay | Admitting: Nurse Practitioner

## 2022-03-15 DIAGNOSIS — N9489 Other specified conditions associated with female genital organs and menstrual cycle: Secondary | ICD-10-CM

## 2022-03-15 MED ORDER — NORETHINDRONE 0.35 MG PO TABS: 1.0000 | ORAL_TABLET | Freq: Three times a day (TID) | ORAL | 0 refills | Status: DC

## 2022-03-15 NOTE — Telephone Encounter (Signed)
If she is not currently taking OCPs she can take Norehisterone 5 mg three times per day starting a few days before her period is expected to start and take up until  she returns from her trip. Make sure she knows her period will start within a week of stopping and this is not a contraception. If she is agreeable I will send to her pharmacy.  ?

## 2022-03-15 NOTE — Telephone Encounter (Signed)
Patient said she is willing to try pill and she is not taking BCP. I informed patient with all the below. Rx can be sent to [facility] ?

## 2022-03-15 NOTE — Telephone Encounter (Signed)
Patient called stating she will be going on a trip next week and wanted to skip her cycle. Asked if medication could be prescribed to do this. I explained that normal BCP are scheduled for this, but must me taken months in advance next week would be too soon. Patient verbalized she understood.  ? ?Just FYI  ?

## 2022-06-23 ENCOUNTER — Ambulatory Visit: Payer: BC Managed Care – PPO | Admitting: Nurse Practitioner

## 2022-06-29 ENCOUNTER — Ambulatory Visit (INDEPENDENT_AMBULATORY_CARE_PROVIDER_SITE_OTHER): Payer: BC Managed Care – PPO | Admitting: Radiology

## 2022-06-29 VITALS — BP 124/76

## 2022-06-29 DIAGNOSIS — L089 Local infection of the skin and subcutaneous tissue, unspecified: Secondary | ICD-10-CM | POA: Diagnosis not present

## 2022-06-29 NOTE — Progress Notes (Signed)
   Melissa Greene 23-Sep-1986 [id number]   History:  36 y.o. G0 presents with c/o an itchy 'bump' on her upper lip x 4 days, has been using abreva with no improvement. No other associated symptoms. Reports being under more stress lately, recently quit her job.  Gynecologic History No LMP recorded.   Contraception/Family planning: coitus interruptus Sexually active: yes Last Pap: 2021. Results were: normal   Obstetric History OB History  Gravida Para Term Preterm AB Living  0 0 0 0 0 0  SAB IAB Ectopic Multiple Live Births  0 0 0 0 0     The following portions of the patient's history were reviewed and updated as appropriate: allergies, current medications, past family history, past medical history, past social history, past surgical history, and problem list.  Review of Systems Pertinent items noted in HPI and remainder of comprehensive ROS otherwise negative.   Past medical history, past surgical history, family history and social history were all reviewed and documented in the EPIC chart.   Exam:  Vitals:   06/29/22 0820  BP: 124/76   There is no height or weight on file to calculate BMI.  General appearance:  Normal Skin:  a 3-7mm pustule is present on the right side of the upper lip. No lesions seen on the lips or in the mouth or on the tongue.  Assessment/Plan:   1. Skin pustule Warm compresses Carmex TID     Anginette Espejo B WHNP-BC 8:30 AM 06/29/2022

## 2022-07-14 ENCOUNTER — Encounter: Payer: Self-pay | Admitting: Nurse Practitioner

## 2022-07-14 ENCOUNTER — Ambulatory Visit (INDEPENDENT_AMBULATORY_CARE_PROVIDER_SITE_OTHER): Payer: BC Managed Care – PPO | Admitting: Nurse Practitioner

## 2022-07-14 VITALS — BP 98/60 | HR 93 | Ht 64.0 in | Wt 244.0 lb

## 2022-07-14 DIAGNOSIS — Z01419 Encounter for gynecological examination (general) (routine) without abnormal findings: Secondary | ICD-10-CM

## 2022-07-14 DIAGNOSIS — N898 Other specified noninflammatory disorders of vagina: Secondary | ICD-10-CM | POA: Diagnosis not present

## 2022-07-14 DIAGNOSIS — B3731 Acute candidiasis of vulva and vagina: Secondary | ICD-10-CM

## 2022-07-14 LAB — WET PREP FOR TRICH, YEAST, CLUE

## 2022-07-14 MED ORDER — TERCONAZOLE 0.8 % VA CREA: 1.0000 | TOPICAL_CREAM | Freq: Every day | VAGINAL | 2 refills | Status: DC

## 2022-07-14 NOTE — Progress Notes (Signed)
   Melissa Greene April 26, 1986 [id number]   History:  36 y.o. G0 presents for annual exam. Monthly cycles.  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 History of recurrent BV and yeast. Complains of vaginal discharge today. 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   Gynecologic History Patient's last menstrual period was 06/18/2022 (approximate). Period Cycle (Days): 28 Period Duration (Days): 4-5 Menstrual Flow:  (varies) Menstrual Control: Maxi pad Dysmenorrhea: (!) Moderate Dysmenorrhea Symptoms: Cramping Contraception/Family planning: abstinence Sexually active: No  Health Maintenance Last Pap: 01/14/2020. 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   ROS:  A ROS was performed and pertinent positives and negatives are included.  Exam:  Vitals:   07/14/22 1438  BP: 98/60  Pulse: 93  SpO2: 99%  Weight: 244 lb (110.7 kg)  Height: 5\' 4"  (1.626 m)   Body mass index is 41.88 kg/m.  General appearance:  Normal Thyroid:  Symmetrical, normal in size, without palpable masses or nodularity. Respiratory  Auscultation:  Clear without wheezing or rhonchi Cardiovascular  Auscultation:  Regular rate, without rubs, murmurs or gallops  Edema/varicosities:  Not grossly evident Abdominal  Soft,nontender, without masses, guarding or rebound.  Liver/spleen:  No organomegaly noted  Hernia:  None appreciated  Skin  Inspection:  Grossly normal   Breasts: Examined lying and sitting.   Right: Without masses, retractions, discharge or axillary adenopathy.   Left: Without masses, retractions, discharge or axillary adenopathy. Genitourinary   Inguinal/mons:  Normal without inguinal adenopathy  External genitalia:  Normal appearing vulva with no masses, tenderness, or  lesions  BUS/Urethra/Skene's glands:  Normal  Vagina:  Normal appearing with normal color and discharge, no lesions  Cervix:  Normal appearing without discharge or lesions  Uterus:  Normal in size, shape and contour.  Midline and mobile, nontender  Adnexa/parametria:     Rt: Normal in size, without masses or tenderness.   Lt: Normal in size, without masses or tenderness.  Anus and perineum: Normal  Digital rectal exam: Not indicated  Patient informed chaperone available to be present for breast and pelvic exam. Patient has requested no chaperone to be present. Patient has been advised what will be completed during breast and pelvic exam.   Wet prep + yeast  Assessment/Plan:  36 y.o. G0 for annual exam.   Well female exam with routine gynecological exam - Plan: CBC with Differential/Platelet, Comprehensive metabolic panel. Education provided on SBEs, importance of preventative screenings, current guidelines, high calcium diet, regular exercise, and multivitamin daily.   Vaginal discharge - Plan: WET PREP FOR TRICH, YEAST, CLUE. Wet prep positive for yeast.  Vaginal candidiasis - Plan: terconazole (TERAZOL 3) 0.8 % vaginal cream nightly x 3 nights.   Screening for cervical cancer - 2006 CIN-1, subsequent paps normal. Will repeat at 5-year interval per guidelines.   Follow up in 1 year for annual.     2007 [facility], 2:50 PM 07/14/2022

## 2022-07-15 ENCOUNTER — Telehealth: Payer: Self-pay | Admitting: Oncology

## 2022-07-15 ENCOUNTER — Telehealth: Payer: Self-pay

## 2022-07-15 DIAGNOSIS — R7989 Other specified abnormal findings of blood chemistry: Secondary | ICD-10-CM

## 2022-07-15 LAB — COMPREHENSIVE METABOLIC PANEL
AG Ratio: 1.3 (calc) (ref 1.0–2.5)
ALT: 11 U/L (ref 6–29)
AST: 16 U/L (ref 10–30)
Albumin: 4.1 g/dL (ref 3.6–5.1)
Alkaline phosphatase (APISO): 67 U/L (ref 31–125)
BUN: 17 mg/dL (ref 7–25)
CO2: 15 mmol/L — ABNORMAL LOW (ref 20–32)
Calcium: 8.9 mg/dL (ref 8.6–10.2)
Chloride: 110 mmol/L (ref 98–110)
Creat: 0.92 mg/dL (ref 0.50–0.97)
Globulin: 3.1 g/dL (calc) (ref 1.9–3.7)
Glucose, Bld: 94 mg/dL (ref 65–99)
Potassium: 4.2 mmol/L (ref 3.5–5.3)
Sodium: 140 mmol/L (ref 135–146)
Total Bilirubin: 0.3 mg/dL (ref 0.2–1.2)
Total Protein: 7.2 g/dL (ref 6.1–8.1)

## 2022-07-15 LAB — CBC WITH DIFFERENTIAL/PLATELET
Absolute Monocytes: 778 cells/uL (ref 200–950)
Basophils Absolute: 69 cells/uL (ref 0–200)
Basophils Relative: 0.9 %
Eosinophils Absolute: 408 cells/uL (ref 15–500)
Eosinophils Relative: 5.3 %
HCT: 42 % (ref 35.0–45.0)
Hemoglobin: 12.8 g/dL (ref 11.7–15.5)
Lymphs Abs: 3203 cells/uL (ref 850–3900)
MCH: 23 pg — ABNORMAL LOW (ref 27.0–33.0)
MCHC: 30.5 g/dL — ABNORMAL LOW (ref 32.0–36.0)
MCV: 75.5 fL — ABNORMAL LOW (ref 80.0–100.0)
MPV: 11.7 fL (ref 7.5–12.5)
Monocytes Relative: 10.1 %
Neutro Abs: 3242 cells/uL (ref 1500–7800)
Neutrophils Relative %: 42.1 %
Platelets: 273 10*3/uL (ref 140–400)
RBC: 5.56 10*6/uL — ABNORMAL HIGH (ref 3.80–5.10)
RDW: 14.7 % (ref 11.0–15.0)
Total Lymphocyte: 41.6 %
WBC: 7.7 10*3/uL (ref 3.8–10.8)

## 2022-07-15 NOTE — Telephone Encounter (Signed)
Olivia Mackie, NP  P [facility] Triage Please send hematology referral for abnormal cbc. Thanks.

## 2022-07-15 NOTE — Telephone Encounter (Signed)
Referral order placed in Epic. 

## 2022-07-15 NOTE — Telephone Encounter (Signed)
Scheduled appt per 8/24 referral. Pt is aware of appt date and time. Pt is aware to arrive 15 mins prior to appt time and to bring and updated insurance card. Pt is aware of appt location.   

## 2022-07-20 NOTE — Telephone Encounter (Signed)
Patient is scheduled 08/13/22.

## 2022-08-12 NOTE — Progress Notes (Deleted)
[facility] Cancer Initial Visit:  Patient Care Team: Patient, No Pcp Per as PCP - General (General Practice)  CHIEF COMPLAINTS/PURPOSE OF CONSULTATION:  Oncology History   No history exists.    HISTORY OF PRESENTING ILLNESS: Melissa Greene 36 y.o. female is here because of  ***  Medical history notable for sleeve gastrectomy in August 2020  July 14, 2022: WBC 7.7 hemoglobin 12.8 MCV 76 platelet count 273; 42 segs 22 lymphs 10 monos 5 eos 1 basophil.  RBC 5.56 CMP notable only for bicarb of 15 August 13, 2022:  [facility] Hematology Consultation  Review of previous labs notable for elevated RBC and microcyctic indices without anemia on serial hemograms    Review of Systems - Oncology  MEDICAL HISTORY: Past Medical History:  Diagnosis Date   Allergy    Anxiety    Asthma    Depression     SURGICAL HISTORY: Past Surgical History:  Procedure Laterality Date   COSMETIC SURGERY  2012   LAPAROSCOPIC GASTRIC SLEEVE RESECTION N/A 07/09/2019   Procedure: LAPAROSCOPIC GASTRIC SLEEVE RESECTION, Upper Endo, ERAS Pathway;  Surgeon: Berna Bue, MD;  Location: [facility];  Service: General;  Laterality: N/A;   SKIN LESION EXCISION Right    Foot   TONSILLECTOMY     WISDOM TOOTH EXTRACTION      SOCIAL HISTORY: Social History   Socioeconomic History   Marital status: Single    Spouse name: Not on file   Number of children: Not on file   Years of education: Not on file   Highest education level: Not on file  Occupational History   Not on file  Tobacco Use   Smoking status: Never   Smokeless tobacco: Never  Vaping Use   Vaping Use: Never used  Substance and Sexual Activity   Alcohol use: Not Currently    Alcohol/week: 0.0 standard drinks of alcohol   Drug use: No   Sexual activity: Not Currently    Partners: Male    Birth control/protection: None  Other Topics Concern   Not on file  Social History Narrative   Not on file   Social  Determinants of Health   Financial Resource Strain: Not on file  Food Insecurity: Not on file  Transportation Needs: Not on file  Physical Activity: Not on file  Stress: Not on file  Social Connections: Not on file  Intimate Partner Violence: Not on file    FAMILY HISTORY Family History  Problem Relation Age of Onset   Diabetes Father    Hypertension Father    Heart disease Father     ALLERGIES:  is allergic to other, penicillins, and ceclor [cefaclor].  MEDICATIONS:  Current Outpatient Medications  Medication Sig Dispense Refill   cetirizine (ZYRTEC) 10 MG tablet Take 10 mg by mouth daily as needed for allergies.     diphenhydrAMINE (BENADRYL) 25 MG tablet Take 25 mg by mouth daily as needed for itching or allergies.      fluticasone (FLONASE) 50 MCG/ACT nasal spray Place 1 spray into both nostrils daily as needed for allergies or rhinitis.     terconazole (TERAZOL 3) 0.8 % vaginal cream Place 1 applicator vaginally at bedtime. 20 g 2   No current facility-administered medications for this visit.    PHYSICAL EXAMINATION:  ECOG PERFORMANCE STATUS: {CHL ONC ECOG PS:[id number]}   There were no vitals filed for this visit.  There were no vitals filed for this visit.   Physical Exam  LABORATORY DATA: I have personally reviewed the data as listed:  Office Visit on 07/14/2022  Component Date Value Ref Range Status   WBC 07/14/2022 7.7  3.8 - 10.8 Thousand/uL Final   RBC 07/14/2022 5.56 (H)  3.80 - 5.10 Million/uL Final   Hemoglobin 07/14/2022 12.8  11.7 - 15.5 g/dL Final   HCT 07/14/2022 42.0  35.0 - 45.0 % Final   MCV 07/14/2022 75.5 (L)  80.0 - 100.0 fL Final   MCH 07/14/2022 23.0 (L)  27.0 - 33.0 pg Final   MCHC 07/14/2022 30.5 (L)  32.0 - 36.0 g/dL Final   RDW 07/14/2022 14.7  11.0 - 15.0 % Final   Platelets 07/14/2022 273  140 - 400 Thousand/uL Final   MPV 07/14/2022 11.7  7.5 - 12.5 fL Final   Neutro Abs 07/14/2022 3,242  1,500 - 7,800 cells/uL Final    Lymphs Abs 07/14/2022 3,203  850 - 3,900 cells/uL Final   Absolute Monocytes 07/14/2022 778  200 - 950 cells/uL Final   Eosinophils Absolute 07/14/2022 408  15 - 500 cells/uL Final   Basophils Absolute 07/14/2022 69  0 - 200 cells/uL Final   Neutrophils Relative % 07/14/2022 42.1  % Final   Total Lymphocyte 07/14/2022 41.6  % Final   Monocytes Relative 07/14/2022 10.1  % Final   Eosinophils Relative 07/14/2022 5.3  % Final   Basophils Relative 07/14/2022 0.9  % Final   Glucose, Bld 07/14/2022 94  65 - 99 mg/dL Final   Comment: .            Fasting reference interval .    BUN 07/14/2022 17  7 - 25 mg/dL Final   Creat 07/14/2022 0.92  0.50 - 0.97 mg/dL Final   BUN/Creatinine Ratio 07/14/2022 SEE NOTE:  6 - 22 (calc) Final   Comment:    Not Reported: BUN and Creatinine are within    reference range. .    Sodium 07/14/2022 140  135 - 146 mmol/L Final   Potassium 07/14/2022 4.2  3.5 - 5.3 mmol/L Final   Chloride 07/14/2022 110  98 - 110 mmol/L Final   CO2 07/14/2022 15 (L)  20 - 32 mmol/L Final   Comment: Analysis performed on aliquoted specimen, CO2 may be decreased due to greater exposure of specimen to air.    Calcium 07/14/2022 8.9  8.6 - 10.2 mg/dL Final   Total Protein 07/14/2022 7.2  6.1 - 8.1 g/dL Final   Albumin 07/14/2022 4.1  3.6 - 5.1 g/dL Final   Globulin 07/14/2022 3.1  1.9 - 3.7 g/dL (calc) Final   AG Ratio 07/14/2022 1.3  1.0 - 2.5 (calc) Final   Total Bilirubin 07/14/2022 0.3  0.2 - 1.2 mg/dL Final   Alkaline phosphatase (APISO) 07/14/2022 67  31 - 125 U/L Final   AST 07/14/2022 16  10 - 30 U/L Final   ALT 07/14/2022 11  6 - 29 U/L Final   Source: 07/14/2022 VAGINA   Final   RESULT 07/14/2022    Final   Comment: EPITHELIAL CELLS-PRESENT CLUE CELLS-NONE SEEN (NO ODOR) YEAST-PRESENT TRICHOMONAS-NONE SEEN WBC-MODERATE BACTERIA-MODERATE EPITH CELLS- (13-20)     RADIOGRAPHIC STUDIES: I have personally reviewed the radiological images as listed and agree with  the findings in the report  No results found.  ASSESSMENT/PLAN Cancer Staging  No matching staging information was found for the patient.   No problem-specific Assessment & Plan notes found for this encounter.   No orders of the defined types were placed in  this encounter.   All questions were answered. The patient knows to call the clinic with any problems, questions or concerns.  This note was electronically signed.    Barbee Cough, MD  08/12/2022 2:49 PM

## 2022-08-13 ENCOUNTER — Telehealth: Payer: Self-pay | Admitting: Hematology and Oncology

## 2022-08-13 ENCOUNTER — Inpatient Hospital Stay: Payer: Self-pay | Attending: Oncology | Admitting: Oncology

## 2022-08-13 NOTE — Telephone Encounter (Signed)
R/s pt's new hem appt per pt request. No showed pt's appt due to pt calling with less than a 24 hr notice. Pt is aware of new appt date/time.

## 2022-08-16 ENCOUNTER — Inpatient Hospital Stay: Payer: Self-pay

## 2022-08-16 ENCOUNTER — Inpatient Hospital Stay: Payer: Self-pay | Admitting: Hematology and Oncology

## 2022-08-16 ENCOUNTER — Telehealth: Payer: Self-pay | Admitting: Hematology and Oncology

## 2022-08-16 NOTE — Telephone Encounter (Signed)
Patient came in to cancel appointment, patient would like to speak with her insurance before rescheduling. 09/25 appointment is cancelled.

## 2022-08-16 NOTE — Progress Notes (Deleted)
[facility] [phone]   [phone]  INITIAL CONSULT NOTE  Patient Care Team: Patient, No Pcp Per as PCP - General (General Practice)  Hematological/Oncological History # Microcytosis without Anemia 07/14/2022: WBC 7.7, RBC 5.56, Hgb 12.8, MCV 75.5, Plt 273 08/16/2022: establish care with Dr. Leonides Schanz   CHIEF COMPLAINTS/PURPOSE OF CONSULTATION:  "Microcytosis without anemia "  HISTORY OF PRESENTING ILLNESS:  Melissa Greene 36 y.o. female with medical history significant for depression and asthma who presents for evaluation of microcytosis without anemia.  On review of the previous records Melissa Greene last had labs drawn on 07/14/2022. Labs showed ***  On exam today Melissa Greene ***  MEDICAL HISTORY:  Past Medical History:  Diagnosis Date   Allergy    Anxiety    Asthma    Depression     SURGICAL HISTORY: Past Surgical History:  Procedure Laterality Date   COSMETIC SURGERY  2012   LAPAROSCOPIC GASTRIC SLEEVE RESECTION N/A 07/09/2019   Procedure: LAPAROSCOPIC GASTRIC SLEEVE RESECTION, Upper Endo, ERAS Pathway;  Surgeon: Berna Bue, MD;  Location: [facility];  Service: General;  Laterality: N/A;   SKIN LESION EXCISION Right    Foot   TONSILLECTOMY     WISDOM TOOTH EXTRACTION      SOCIAL HISTORY: Social History   Socioeconomic History   Marital status: Single    Spouse name: Not on file   Number of children: Not on file   Years of education: Not on file   Highest education level: Not on file  Occupational History   Not on file  Tobacco Use   Smoking status: Never   Smokeless tobacco: Never  Vaping Use   Vaping Use: Never used  Substance and Sexual Activity   Alcohol use: Not Currently    Alcohol/week: 0.0 standard drinks of alcohol   Drug use: No   Sexual activity: Not Currently    Partners: Male    Birth control/protection: None  Other Topics Concern   Not on file  Social History Narrative   Not on file    Social Determinants of Health   Financial Resource Strain: Not on file  Food Insecurity: Not on file  Transportation Needs: Not on file  Physical Activity: Not on file  Stress: Not on file  Social Connections: Not on file  Intimate Partner Violence: Not on file    FAMILY HISTORY: Family History  Problem Relation Age of Onset   Diabetes Father    Hypertension Father    Heart disease Father     ALLERGIES:  is allergic to other, penicillins, and ceclor [cefaclor].  MEDICATIONS:  Current Outpatient Medications  Medication Sig Dispense Refill   cetirizine (ZYRTEC) 10 MG tablet Take 10 mg by mouth daily as needed for allergies.     diphenhydrAMINE (BENADRYL) 25 MG tablet Take 25 mg by mouth daily as needed for itching or allergies.      fluticasone (FLONASE) 50 MCG/ACT nasal spray Place 1 spray into both nostrils daily as needed for allergies or rhinitis.     terconazole (TERAZOL 3) 0.8 % vaginal cream Place 1 applicator vaginally at bedtime. 20 g 2   No current facility-administered medications for this visit.    REVIEW OF SYSTEMS:   Constitutional: ( - ) fevers, ( - )  chills , ( - ) night sweats Eyes: ( - ) blurriness of vision, ( - ) double vision, ( - ) watery eyes Ears, nose, mouth, throat, and face: ( - )  mucositis, ( - ) sore throat Respiratory: ( - ) cough, ( - ) dyspnea, ( - ) wheezes Cardiovascular: ( - ) palpitation, ( - ) chest discomfort, ( - ) lower extremity swelling Gastrointestinal:  ( - ) nausea, ( - ) heartburn, ( - ) change in bowel habits Skin: ( - ) abnormal skin rashes Lymphatics: ( - ) new lymphadenopathy, ( - ) easy bruising Neurological: ( - ) numbness, ( - ) tingling, ( - ) new weaknesses Behavioral/Psych: ( - ) mood change, ( - ) new changes  All other systems were reviewed with the patient and are negative.  PHYSICAL EXAMINATION:  There were no vitals filed for this visit. There were no vitals filed for this visit.  GENERAL: well appearing  *** in NAD  SKIN: skin color, texture, turgor are normal, no rashes or significant lesions EYES: conjunctiva are pink and non-injected, sclera clear LUNGS: clear to auscultation and percussion with normal breathing effort HEART: regular rate & rhythm and no murmurs and no lower extremity edema Musculoskeletal: no cyanosis of digits and no clubbing  PSYCH: alert & oriented x 3, fluent speech NEURO: no focal motor/sensory deficits  LABORATORY DATA:  I have reviewed the data as listed    Latest Ref Rng & Units 07/14/2022    3:09 PM 01/19/2021    3:38 PM 07/10/2019    3:34 AM  CBC  WBC 3.8 - 10.8 Thousand/uL 7.7  7.6  10.7   Hemoglobin 11.7 - 15.5 g/dL 12.8  14.1  12.2   Hematocrit 35.0 - 45.0 % 42.0  44.9  39.9   Platelets 140 - 400 Thousand/uL 273  369  296        Latest Ref Rng & Units 07/14/2022    3:09 PM 01/19/2021    3:38 PM 07/10/2019    3:34 AM  CMP  Glucose 65 - 99 mg/dL 94  123  103   BUN 7 - 25 mg/dL 17  18  12    Creatinine 0.50 - 0.97 mg/dL 0.92  0.83  0.79   Sodium 135 - 146 mmol/L 140  140  137   Potassium 3.5 - 5.3 mmol/L 4.2  3.9  4.2   Chloride 98 - 110 mmol/L 110  109  106   CO2 20 - 32 mmol/L 15  21  22    Calcium 8.6 - 10.2 mg/dL 8.9  9.3  8.2   Total Protein 6.1 - 8.1 g/dL 7.2  7.0  6.0   Total Bilirubin 0.2 - 1.2 mg/dL 0.3  0.4  0.5   Alkaline Phos 38 - 126 U/L   60   AST 10 - 30 U/L 16  22  19    ALT 6 - 29 U/L 11  18  28       ASSESSMENT & PLAN Melissa Greene 36 y.o. female with medical history significant for depression and asthma who presents for evaluation of microcytosis without anemia.  After review of the labs, review of the records, and discussion with the patient the patients findings are most consistent with ***  #Microcytosis without Anemia # Elevated RBC Count --at this time findings are most consistent with an undiagnosed thalassemia.  --today will order Hgb electrophoresis and Alpha globulin gene testing. Suspect an alpha  thalassemia --will also order iron panel and ferritin --repeat CBC and CMP.  --RTC pending the results of the above studies.   No orders of the defined types were placed in this encounter.   All questions  were answered. The patient knows to call the clinic with any problems, questions or concerns.  A total of more than {CHL ONC TIME VISIT - ACZYS:[id number]} were spent on this encounter with face-to-face time and non-face-to-face time, including preparing to see the patient, ordering tests and/or medications, counseling the patient and coordination of care as outlined above.   Ulysees Barns, MD Department of Hematology/Oncology [facility] Phone: [phone] Pager: [phone] Email: [email or web address]  08/16/2022 7:47 AM

## 2023-01-28 ENCOUNTER — Encounter (HOSPITAL_COMMUNITY): Payer: Self-pay | Admitting: *Deleted

## 2023-08-19 ENCOUNTER — Telehealth: Payer: Self-pay | Admitting: *Deleted

## 2023-08-19 ENCOUNTER — Ambulatory Visit: Payer: BC Managed Care – PPO | Admitting: Obstetrics and Gynecology

## 2023-08-19 NOTE — Telephone Encounter (Signed)
Spoke with patient. Patient was scheduled for an appt with Dr. Karma Greaser this morning for possible yeast infection. Her appt was scheduled at 0920 for a 10am appt. Patient was stuck in traffic due to the weather.   Patient reports internal vaginal itching increasing over the past day and rash on lower back. Denies any other GYN symptoms, fever/chills. Patient has taken diflucan in the past monthly after her menses to prevent yeast infections, has not had Rx for 2 months, overdue for AEX.   Patient request AEX with any available provider, scheduled with Dr. Kennith Center on 10/23 at 3pm.   Patient declines to r/s OV at this time, asking for OTC options. Advised may try OTC monistat 1, 3 or 7 day. If symptoms do not resolve or new symptoms develop, return call to office for OV. Patient asking if RX for yeast can be called in? Advised will send to provider for final review and f/u if any additional recommendations. Patient agreeable.   Routing to provider for final review. Patient is agreeable to disposition. Will close encounter.

## 2023-08-22 MED ORDER — FLUCONAZOLE 150 MG PO TABS: 150.0000 mg | ORAL_TABLET | Freq: Once | ORAL | 0 refills | Status: AC

## 2023-08-22 NOTE — Addendum Note (Signed)
Addended by: Leda Min on: 08/22/2023 08:15 AM   Modules accepted: Orders

## 2023-08-22 NOTE — Telephone Encounter (Signed)
Spoke with patient, advised per Dr. Kennith Center. Rx to verified pharmacy.   Encounter closed.

## 2023-09-14 ENCOUNTER — Ambulatory Visit (INDEPENDENT_AMBULATORY_CARE_PROVIDER_SITE_OTHER): Payer: BC Managed Care – PPO | Admitting: Obstetrics and Gynecology

## 2023-09-14 ENCOUNTER — Encounter: Payer: Self-pay | Admitting: Obstetrics and Gynecology

## 2023-09-14 VITALS — BP 112/68 | Ht 64.75 in | Wt 250.0 lb

## 2023-09-14 DIAGNOSIS — N898 Other specified noninflammatory disorders of vagina: Secondary | ICD-10-CM | POA: Diagnosis not present

## 2023-09-14 DIAGNOSIS — B360 Pityriasis versicolor: Secondary | ICD-10-CM | POA: Diagnosis not present

## 2023-09-14 DIAGNOSIS — Z01419 Encounter for gynecological examination (general) (routine) without abnormal findings: Secondary | ICD-10-CM | POA: Insufficient documentation

## 2023-09-14 DIAGNOSIS — Z113 Encounter for screening for infections with a predominantly sexual mode of transmission: Secondary | ICD-10-CM

## 2023-09-14 LAB — WET PREP FOR TRICH, YEAST, CLUE

## 2023-09-14 MED ORDER — KETOCONAZOLE 2 % EX SHAM: 1.0000 | MEDICATED_SHAMPOO | Freq: Every evening | CUTANEOUS | 0 refills | Status: AC

## 2023-09-14 NOTE — Patient Instructions (Signed)

## 2023-09-14 NOTE — Assessment & Plan Note (Signed)
Hx of bariatric surgery Encouraged healthy diet and exercise.

## 2023-09-14 NOTE — Progress Notes (Signed)
37 y.o. G0P0000 female with hx of CIN 1 (2010), hx of sleeve gastrectomy here for annual exam. High school teacher.  Patient's last menstrual period was 08/17/2023 (approximate). Period Cycle (Days): 28 Period Duration (Days): 4 Period Pattern: Regular Menstrual Flow: Heavy, Moderate, Light Menstrual Control: Maxi pad, Thin pad Dysmenorrhea: (!) Mild Dysmenorrhea Symptoms: Cramping  Recently treated yeast infection. Concerned about rash on back, tearful about appearance of it. Has appointment with dermatology next month.  Abnormal bleeding: none Pelvic discharge or pain: +odor Breast mass, nipple discharge or skin changes : none Birth control: none Last PAP: 2021 NIL, HPV neg Gardasil: incomplete, declined Sexually active: no  Exercising: no exercise, has a peloton but not riding  GYN HISTORY: CIN 1 (2006)  OB History  Gravida Para Term Preterm AB Living  0 0 0 0 0 0  SAB IAB Ectopic Multiple Live Births  0 0 0 0 0    Past Medical History:  Diagnosis Date   Allergy    Anxiety    Asthma    Depression     Past Surgical History:  Procedure Laterality Date   COSMETIC SURGERY  2012   LAPAROSCOPIC GASTRIC SLEEVE RESECTION N/A 07/09/2019   Procedure: LAPAROSCOPIC GASTRIC SLEEVE RESECTION, Upper Endo, ERAS Pathway;  Surgeon: Berna Bue, MD;  Location: [facility];  Service: General;  Laterality: N/A;   SKIN LESION EXCISION Right    Foot   TONSILLECTOMY     WISDOM TOOTH EXTRACTION      Current Outpatient Medications on File Prior to Visit  Medication Sig Dispense Refill   cetirizine (ZYRTEC) 10 MG tablet Take 10 mg by mouth daily as needed for allergies.     diphenhydrAMINE (BENADRYL) 25 MG tablet Take 25 mg by mouth daily as needed for itching or allergies.      fluticasone (FLONASE) 50 MCG/ACT nasal spray Place 1 spray into both nostrils daily as needed for allergies or rhinitis.     No current facility-administered medications on file prior to visit.     Social History   Socioeconomic History   Marital status: Single    Spouse name: Not on file   Number of children: Not on file   Years of education: Not on file   Highest education level: Not on file  Occupational History   Not on file  Tobacco Use   Smoking status: Never    Passive exposure: Never   Smokeless tobacco: Never  Vaping Use   Vaping status: Never Used  Substance and Sexual Activity   Alcohol use: Not Currently    Alcohol/week: 0.0 standard drinks of alcohol   Drug use: No   Sexual activity: Not Currently    Partners: Male    Birth control/protection: None  Other Topics Concern   Not on file  Social History Narrative   Not on file   Social Determinants of Health   Financial Resource Strain: Not on file  Food Insecurity: Not on file  Transportation Needs: Not on file  Physical Activity: Not on file  Stress: Not on file  Social Connections: Not on file  Intimate Partner Violence: Not on file    Family History  Problem Relation Age of Onset   Diabetes Father    Hypertension Father    Heart disease Father     Allergies  Allergen Reactions   Other     NO BLOOD PRODUCTS    Penicillins Anaphylaxis and Swelling    Did it involve swelling of  the face/tongue/throat, SOB, or low BP? Yes Did it involve sudden or severe rash/hives, skin peeling, or any reaction on the inside of your mouth or nose? No Did you need to seek medical attention at a hospital or doctor's office? Yes When did it last happen?      childhood allergy If all above answers are "NO", may proceed with cephalosporin use.    Ceclor [Cefaclor] Swelling      PE Today's Vitals   09/14/23 1453  BP: 112/68  Weight: 250 lb (113.4 kg)  Height: 5' 4.75" (1.645 m)   Body mass index is 41.92 kg/m.  Physical Exam Vitals reviewed. Exam conducted with a chaperone present.  Constitutional:      General: She is not in acute distress.    Appearance: Normal appearance.  HENT:     Head:  Normocephalic and atraumatic.     Nose: Nose normal.  Eyes:     Extraocular Movements: Extraocular movements intact.     Conjunctiva/sclera: Conjunctivae normal.  Neck:     Thyroid: No thyroid mass, thyromegaly or thyroid tenderness.  Pulmonary:     Effort: Pulmonary effort is normal.  Chest:     Chest wall: No mass or tenderness.  Breasts:    Right: Normal. No swelling, mass, nipple discharge, skin change or tenderness.     Left: Normal. No swelling, mass, nipple discharge, skin change or tenderness.  Abdominal:     General: There is no distension.     Palpations: Abdomen is soft.     Tenderness: There is no abdominal tenderness.  Genitourinary:    General: Normal vulva.     Exam position: Lithotomy position.     Urethra: No prolapse.     Vagina: Normal. No vaginal discharge or bleeding.     Cervix: Normal. No lesion.     Uterus: Normal. Not enlarged and not tender.      Adnexa: Right adnexa normal and left adnexa normal.  Musculoskeletal:        General: Normal range of motion.     Cervical back: Normal range of motion.  Lymphadenopathy:     Upper Body:     Right upper body: No axillary adenopathy.     Left upper body: No axillary adenopathy.     Lower Body: No right inguinal adenopathy. No left inguinal adenopathy.  Skin:    General: Skin is warm and dry.     Findings: Rash (hyperpigmented papular rash along back) present.  Neurological:     General: No focal deficit present.     Mental Status: She is alert.  Psychiatric:        Mood and Affect: Mood normal.        Behavior: Behavior normal.       Assessment and Plan:        Vaginal odor -     WET PREP FOR TRICH, YEAST, CLUE  Screen for STD (sexually transmitted disease) -     Hepatitis B surface antigen -     Hepatitis C antibody -     SURESWAB CT/NG/T. vaginalis -     HIV Antibody (routine testing w rflx) -     RPR  Tinea versicolor -     Ketoconazole; Apply 1 Application topically at bedtime for 3  days.  Dispense: 360 mL; Refill: 0  Well woman exam with routine gynecological exam Assessment & Plan: Cervical cancer screening performed according to ASCCP guidelines. Labs and immunizations with her primary Encouraged safe sexual  practices as indicated Encouraged healthy lifestyle practices with diet and exercise For patients under 50yo, I recommend 1000mg  calcium daily and 600IU of vitamin D daily.    Morbid obesity (HCC) Assessment & Plan: Hx of bariatric surgery Encouraged healthy diet and exercise.   Rosalyn Gess, MD

## 2023-09-14 NOTE — Assessment & Plan Note (Signed)
Cervical cancer screening performed according to ASCCP guidelines. Labs and immunizations with her primary Encouraged safe sexual practices as indicated Encouraged healthy lifestyle practices with diet and exercise For patients under 38yo, I recommend 1000mg  calcium daily and 600IU of vitamin D daily.

## 2023-09-15 LAB — SURESWAB CT/NG/T. VAGINALIS
C. trachomatis RNA, TMA: NOT DETECTED
N. gonorrhoeae RNA, TMA: NOT DETECTED
Trichomonas vaginalis RNA: NOT DETECTED

## 2023-09-15 LAB — HEPATITIS B SURFACE ANTIGEN: Hepatitis B Surface Ag: NONREACTIVE

## 2023-09-15 LAB — HIV ANTIBODY (ROUTINE TESTING W REFLEX): HIV 1&2 Ab, 4th Generation: NONREACTIVE

## 2023-09-15 LAB — RPR: RPR Ser Ql: NONREACTIVE

## 2023-09-15 LAB — HEPATITIS C ANTIBODY: Hepatitis C Ab: NONREACTIVE

## 2023-12-02 ENCOUNTER — Ambulatory Visit (INDEPENDENT_AMBULATORY_CARE_PROVIDER_SITE_OTHER): Payer: 59 | Admitting: Obstetrics and Gynecology

## 2023-12-02 ENCOUNTER — Encounter: Payer: Self-pay | Admitting: Obstetrics and Gynecology

## 2023-12-02 VITALS — BP 108/70 | HR 90 | Wt 242.0 lb

## 2023-12-02 DIAGNOSIS — N760 Acute vaginitis: Secondary | ICD-10-CM | POA: Diagnosis not present

## 2023-12-02 DIAGNOSIS — N898 Other specified noninflammatory disorders of vagina: Secondary | ICD-10-CM

## 2023-12-02 LAB — WET PREP FOR TRICH, YEAST, CLUE

## 2023-12-02 MED ORDER — METRONIDAZOLE 0.75 % VA GEL: 1.0000 | Freq: Every day | VAGINAL | 1 refills | Status: AC

## 2023-12-02 NOTE — Progress Notes (Signed)
 38 y.o. G0P0000 female here for vaginal discharge.  Patient's last menstrual period was 11/10/2023 (approximate).    Pt states she slept with a partner from her past who gave her BV and she wants to be sure she doesn't have a yeast infection or BV.  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  Symptoms have improved over the past 2 years.  However now she is concerned. Boric acid was recommended but she hasn't used it yet. Boquet vaginal product is a brand that she is curious about.  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     clotrimazole-betamethasone (LOTRISONE) cream Apply topically 2 (two) times daily.     diphenhydrAMINE (BENADRYL) 25 MG tablet Take 25 mg by mouth daily as needed for itching or allergies.      fluticasone (FLONASE) 50 MCG/ACT nasal spray Place 1 spray into both nostrils daily as needed for allergies or rhinitis.     SSD 1 % cream Apply topically 2 (two) times daily.     No current facility-administered medications on file prior to visit.    Allergies  Allergen Reactions   Other     NO BLOOD PRODUCTS    Penicillins Anaphylaxis and Swelling    Did it involve swelling of the  face/tongue/throat, SOB, or low BP? Yes Did it involve sudden or severe rash/hives, skin peeling, or any reaction on the inside of your mouth or nose? No Did you need to seek medical attention at a hospital or doctor's office? Yes When did it last happen?      childhood allergy If all above answers are "NO", may proceed with cephalosporin use.    Ceclor [Cefaclor] Swelling      PE Today's Vitals   12/02/23 1205  BP: 108/70  Pulse: 90  SpO2: 98%  Weight: 242 lb (109.8 kg)   Body mass index is 40.58 kg/m.  Physical Exam Vitals reviewed. Exam conducted with a chaperone present.  Constitutional:      General: She is not in acute distress.    Appearance: Normal appearance.  HENT:     Head: Normocephalic and atraumatic.     Nose: Nose normal.  Eyes:     Extraocular Movements: Extraocular movements intact.     Conjunctiva/sclera: Conjunctivae normal.  Pulmonary:     Effort: Pulmonary effort is normal.  Genitourinary:    General: Normal vulva.     Exam position: Lithotomy position.     Vagina: Vaginal discharge present.     Cervix: Normal. No cervical motion tenderness, discharge  or lesion.     Uterus: Normal. Not enlarged and not tender.      Adnexa: Right adnexa normal and left adnexa normal.  Musculoskeletal:        General: Normal range of motion.     Cervical back: Normal range of motion.  Neurological:     General: No focal deficit present.     Mental Status: She is alert.  Psychiatric:        Mood and Affect: Mood normal.        Behavior: Behavior normal.       Assessment and Plan:        Vaginal discharge -     WET PREP FOR TRICH, YEAST, CLUE  BV (bacterial vaginosis) -     metroNIDAZOLE ; Place 1 Applicatorful vaginally at bedtime for 5 days.  Dispense: 50 g; Refill: 1   Vaginal hygiene reviewed, including avoiding scented soaps, lotions, and menstrual products, perfumes, and douching and applying any products directly into the vagina.   Vera LULLA Pa, MD

## 2023-12-16 ENCOUNTER — Other Ambulatory Visit: Payer: Self-pay | Admitting: Obstetrics and Gynecology

## 2023-12-16 DIAGNOSIS — B3731 Acute candidiasis of vulva and vagina: Secondary | ICD-10-CM

## 2023-12-20 ENCOUNTER — Ambulatory Visit (INDEPENDENT_AMBULATORY_CARE_PROVIDER_SITE_OTHER): Payer: 59 | Admitting: Nurse Practitioner

## 2023-12-20 VITALS — BP 122/64 | HR 81 | Wt 252.0 lb

## 2023-12-20 DIAGNOSIS — N760 Acute vaginitis: Secondary | ICD-10-CM

## 2023-12-20 DIAGNOSIS — Z113 Encounter for screening for infections with a predominantly sexual mode of transmission: Secondary | ICD-10-CM | POA: Diagnosis not present

## 2023-12-20 DIAGNOSIS — B9689 Other specified bacterial agents as the cause of diseases classified elsewhere: Secondary | ICD-10-CM

## 2023-12-20 DIAGNOSIS — N898 Other specified noninflammatory disorders of vagina: Secondary | ICD-10-CM

## 2023-12-20 LAB — WET PREP FOR TRICH, YEAST, CLUE

## 2023-12-20 MED ORDER — METRONIDAZOLE 500 MG PO TABS: 500.0000 mg | ORAL_TABLET | Freq: Two times a day (BID) | ORAL | 0 refills | Status: DC

## 2023-12-20 NOTE — Progress Notes (Signed)
   Acute Office Visit  Subjective:    Patient ID: Melissa Greene, female    DOB: [birth year], 38 y.o.   MRN: [id number]   HPI 38 y.o. presents today for vaginal itching and irritation. Treated for BV with Metrogel 12/02/2023. Felt like symptoms resolved. Sexually active, would like STD screening. Sexually active with an old partner. She used to get recurrent BV with the partner in the past.   Patient's last menstrual period was 12/07/2023 (approximate).    Review of Systems  Constitutional: Negative.   Genitourinary:  Positive for vaginal discharge and vaginal pain.       Objective:    Physical Exam Constitutional:      Appearance: Normal appearance.  Genitourinary:    General: Normal vulva.     Vagina: Vaginal discharge present. No erythema.     Cervix: Normal.     BP 122/64 (BP Location: Right Arm, Patient Position: Sitting, Cuff Size: Large)   Pulse 81   Wt 252 lb (114.3 kg)   LMP 12/07/2023 (Approximate)   SpO2 98%   BMI 42.26 kg/m  Wt Readings from Last 3 Encounters:  12/20/23 252 lb (114.3 kg)  12/02/23 242 lb (109.8 kg)  09/14/23 250 lb (113.4 kg)        Patient informed chaperone available to be present for breast and/or pelvic exam. Patient has requested no chaperone to be present. Patient has been advised what will be completed during breast and pelvic exam.   Wet prep + clue cells (+ odor)  Assessment & Plan:   Problem List Items Addressed This Visit   None Visit Diagnoses       Bacterial vaginosis    -  Primary   Relevant Medications   metroNIDAZOLE (FLAGYL) 500 MG tablet     Vaginal itching       Relevant Orders   WET PREP FOR TRICH, YEAST, CLUE (Completed)     Screening examination for STD (sexually transmitted disease)       Relevant Orders   C. trachomatis/N. gonorrhoeae RNA      Plan: Wet prep positive for clue cells - Flagyl 500 mg BID x 7 days. Discussed vaginal health, probiotics, boric acid, condoms for prevention. GC/CT  pending.   Return if symptoms worsen or fail to improve.    Olivia Mackie DNP, 3:59 PM 12/20/2023

## 2023-12-21 LAB — C. TRACHOMATIS/N. GONORRHOEAE RNA
C. trachomatis RNA, TMA: NOT DETECTED
N. gonorrhoeae RNA, TMA: NOT DETECTED

## 2023-12-22 ENCOUNTER — Encounter: Payer: Self-pay | Admitting: Nurse Practitioner

## 2024-02-10 ENCOUNTER — Encounter (HOSPITAL_COMMUNITY): Payer: Self-pay | Admitting: *Deleted

## 2024-06-15 ENCOUNTER — Ambulatory Visit: Admitting: Radiology

## 2024-06-15 ENCOUNTER — Telehealth: Payer: Self-pay | Admitting: *Deleted

## 2024-06-15 NOTE — Telephone Encounter (Signed)
 No additional recommendations.

## 2024-06-15 NOTE — Telephone Encounter (Signed)
 Spoke with patient. Patient reports intermittent sharp pain in vagina twice yesterday.   Denies vaginal odor, discharge, itching, irregular bleeding, urinary symptoms.   Reports bowel movements are regular as of 2 weeks ago.   LMP 2 weeks ago, uncertain of exact date. Periods are regular and normal flow.   Has not been sexually active since 11/2023.   Patient declines OV.   Last AEX 07/14/22; OV 12/20/23, tx for BV  Advised OV recommended for further evaluation. If symptoms continue or new symptoms develop, return call to schedule OV. ER/Urgent care if afterhours.   Advised I will also forward to Tiffany, our office will return call if any additional recommendations. Patient agreeable.   Routing to [employer] for final review.

## 2024-12-21 ENCOUNTER — Encounter: Payer: Self-pay | Admitting: Radiology

## 2024-12-21 ENCOUNTER — Ambulatory Visit (INDEPENDENT_AMBULATORY_CARE_PROVIDER_SITE_OTHER): Admitting: Radiology

## 2024-12-21 VITALS — BP 114/66 | HR 63 | Wt 237.0 lb

## 2024-12-21 DIAGNOSIS — Z113 Encounter for screening for infections with a predominantly sexual mode of transmission: Secondary | ICD-10-CM | POA: Diagnosis not present

## 2024-12-21 DIAGNOSIS — N898 Other specified noninflammatory disorders of vagina: Secondary | ICD-10-CM

## 2024-12-21 NOTE — Progress Notes (Signed)
" ° ° ° ° °  Subjective: Melissa Greene is a 39 y.o. female here for STI screen, recently had sex after a period of abstinence. Slight increase in d/c. States her face often breaks out when she has BV and she is experiencing this now.    Review of Systems  All other systems reviewed and are negative.   Past Medical History:  Diagnosis Date   Allergy    Anxiety    Asthma    Depression       Objective:  Today's Vitals   12/21/24 1214  BP: 114/66  Pulse: 63  SpO2: 98%  Weight: 237 lb (107.5 kg)   Body mass index is 39.74 kg/m.   Physical Exam Vitals and nursing note reviewed. Exam conducted with a chaperone present.  Constitutional:      Appearance: Normal appearance. She is well-developed.  Pulmonary:     Effort: Pulmonary effort is normal.  Abdominal:     General: Abdomen is flat.     Palpations: Abdomen is soft.  Genitourinary:    General: Normal vulva.     Vagina: Vaginal discharge present. No erythema, bleeding or lesions.     Cervix: Normal. No discharge, friability, lesion or erythema.     Uterus: Normal.      Adnexa: Right adnexa normal and left adnexa normal.  Neurological:     Mental Status: She is alert.  Psychiatric:        Mood and Affect: Mood normal.        Thought Content: Thought content normal.        Judgment: Judgment normal.      Darice Hoit, CMA present for exam  Assessment:/Plan:   1. Screen for STD (sexually transmitted disease) (Primary) - SureSwab Advanced Vaginitis Plus,TMA  2. Vaginal discharge - SureSwab Advanced Vaginitis Plus,TMA    Avoid intercourse until symptoms are resolved. Safe sex encouraged. Avoid the use of soaps or perfumed products in the peri area. Avoid tub baths and sitting in sweaty or wet clothing for prolonged periods of time.    Hristopher Missildine B, NP 12:30 PM   "

## 2024-12-24 LAB — SURESWAB® ADVANCED VAGINITIS PLUS,TMA
C. trachomatis RNA, TMA: NOT DETECTED
CANDIDA SPECIES: NOT DETECTED
Candida glabrata: NOT DETECTED
N. gonorrhoeae RNA, TMA: NOT DETECTED
SURESWAB(R) ADV BACTERIAL VAGINOSIS(BV),TMA: NEGATIVE
TRICHOMONAS VAGINALIS (TV),TMA: NOT DETECTED

## 2024-12-25 ENCOUNTER — Ambulatory Visit: Payer: Self-pay | Admitting: Radiology

## 2025-02-01 ENCOUNTER — Ambulatory Visit: Admitting: Radiology
